# Patient Record
Sex: Female | Born: 1950 | Race: Black or African American | Hispanic: No | Marital: Single | Smoking: Former smoker
Health system: Southern US, Community
[De-identification: ages and names within clinical notes are randomized; demographics above are authoritative.]

## PROBLEM LIST (undated history)

## (undated) DIAGNOSIS — I4892 Unspecified atrial flutter: Secondary | ICD-10-CM

## (undated) DIAGNOSIS — K922 Gastrointestinal hemorrhage, unspecified: Secondary | ICD-10-CM

## (undated) DIAGNOSIS — I509 Heart failure, unspecified: Secondary | ICD-10-CM

## (undated) DIAGNOSIS — E669 Obesity, unspecified: Secondary | ICD-10-CM

## (undated) DIAGNOSIS — G4730 Sleep apnea, unspecified: Secondary | ICD-10-CM

## (undated) DIAGNOSIS — R001 Bradycardia, unspecified: Secondary | ICD-10-CM

## (undated) DIAGNOSIS — I10 Essential (primary) hypertension: Secondary | ICD-10-CM

## (undated) DIAGNOSIS — K219 Gastro-esophageal reflux disease without esophagitis: Secondary | ICD-10-CM

## (undated) DIAGNOSIS — I639 Cerebral infarction, unspecified: Secondary | ICD-10-CM

## (undated) HISTORY — PX: TONSILLECTOMY: SUR1361

## (undated) HISTORY — PX: APPENDECTOMY: SHX54

## (undated) HISTORY — PX: BLADDER REPAIR: SHX76

## (undated) HISTORY — PX: OVARY SURGERY: SHX727

---

## 2008-11-30 ENCOUNTER — Ambulatory Visit: Payer: Self-pay | Admitting: Interventional Radiology

## 2008-11-30 ENCOUNTER — Emergency Department (HOSPITAL_BASED_OUTPATIENT_CLINIC_OR_DEPARTMENT_OTHER): Admission: EM | Admit: 2008-11-30 | Discharge: 2008-11-30 | Payer: Self-pay | Admitting: Emergency Medicine

## 2008-12-05 DIAGNOSIS — I639 Cerebral infarction, unspecified: Secondary | ICD-10-CM

## 2008-12-05 HISTORY — DX: Cerebral infarction, unspecified: I63.9

## 2010-08-13 ENCOUNTER — Emergency Department (HOSPITAL_BASED_OUTPATIENT_CLINIC_OR_DEPARTMENT_OTHER): Admission: EM | Admit: 2010-08-13 | Discharge: 2010-08-13 | Payer: Self-pay | Admitting: Emergency Medicine

## 2011-09-09 LAB — CBC
MCV: 92.7 fL (ref 78.0–100.0)
RBC: 4.22 MIL/uL (ref 3.87–5.11)
WBC: 5.6 10*3/uL (ref 4.0–10.5)

## 2011-09-09 LAB — POCT CARDIAC MARKERS
Myoglobin, poc: 104 ng/mL (ref 12–200)
Troponin i, poc: <0.05 ng/mL (ref 0.00–0.09)

## 2011-09-09 LAB — BASIC METABOLIC PANEL
Chloride: 104 mEq/L (ref 96–112)
Creatinine, Ser: 0.9 mg/dL (ref 0.4–1.2)
GFR calc Af Amer: >60 mL/min (ref >60)
Potassium: 3.3 mEq/L — ABNORMAL LOW (ref 3.5–5.1)

## 2011-09-09 LAB — DIFFERENTIAL
Eosinophils Absolute: 0.1 10*3/uL (ref 0.0–0.7)
Lymphs Abs: 1.4 10*3/uL (ref 0.7–4.0)
Monocytes Relative: 8 % (ref 3–12)
Neutrophils Relative %: 63 % (ref 43–77)

## 2012-01-18 ENCOUNTER — Encounter (HOSPITAL_BASED_OUTPATIENT_CLINIC_OR_DEPARTMENT_OTHER): Payer: Self-pay | Admitting: *Deleted

## 2012-01-18 DIAGNOSIS — R0602 Shortness of breath: Secondary | ICD-10-CM | POA: Insufficient documentation

## 2012-01-18 DIAGNOSIS — J45909 Unspecified asthma, uncomplicated: Secondary | ICD-10-CM | POA: Insufficient documentation

## 2012-01-18 DIAGNOSIS — I509 Heart failure, unspecified: Secondary | ICD-10-CM | POA: Insufficient documentation

## 2012-01-18 DIAGNOSIS — I10 Essential (primary) hypertension: Secondary | ICD-10-CM | POA: Insufficient documentation

## 2012-01-18 NOTE — ED Notes (Signed)
Patient states that she "can't breathe". Has a productive cough, short of breath in triage

## 2012-01-19 ENCOUNTER — Emergency Department (INDEPENDENT_AMBULATORY_CARE_PROVIDER_SITE_OTHER): Payer: PRIVATE HEALTH INSURANCE

## 2012-01-19 ENCOUNTER — Emergency Department (HOSPITAL_BASED_OUTPATIENT_CLINIC_OR_DEPARTMENT_OTHER)
Admission: EM | Admit: 2012-01-19 | Discharge: 2012-01-19 | Disposition: A | Discharge status: Home / Self Care | Payer: PRIVATE HEALTH INSURANCE | Attending: Emergency Medicine | Admitting: Emergency Medicine

## 2012-01-19 ENCOUNTER — Other Ambulatory Visit: Payer: Self-pay

## 2012-01-19 ENCOUNTER — Encounter (HOSPITAL_BASED_OUTPATIENT_CLINIC_OR_DEPARTMENT_OTHER): Payer: Self-pay | Admitting: *Deleted

## 2012-01-19 DIAGNOSIS — R05 Cough: Secondary | ICD-10-CM

## 2012-01-19 DIAGNOSIS — I517 Cardiomegaly: Secondary | ICD-10-CM

## 2012-01-19 DIAGNOSIS — I509 Heart failure, unspecified: Secondary | ICD-10-CM

## 2012-01-19 DIAGNOSIS — I289 Disease of pulmonary vessels, unspecified: Secondary | ICD-10-CM

## 2012-01-19 DIAGNOSIS — R0989 Other specified symptoms and signs involving the circulatory and respiratory systems: Secondary | ICD-10-CM

## 2012-01-19 HISTORY — DX: Essential (primary) hypertension: I10

## 2012-01-19 HISTORY — DX: Heart failure, unspecified: I50.9

## 2012-01-19 HISTORY — DX: Unspecified atrial flutter: I48.92

## 2012-01-19 LAB — CBC
Platelets: 186 10*3/uL (ref 150–400)
RDW: 14.6 % (ref 11.5–15.5)
WBC: 8.8 10*3/uL (ref 4.0–10.5)

## 2012-01-19 LAB — DIFFERENTIAL
Basophils Absolute: 0 10*3/uL (ref 0.0–0.1)
Lymphocytes Relative: 9 % — ABNORMAL LOW (ref 12–46)
Neutro Abs: 7.5 10*3/uL (ref 1.7–7.7)
Neutrophils Relative %: 85 % — ABNORMAL HIGH (ref 43–77)

## 2012-01-19 LAB — COMPREHENSIVE METABOLIC PANEL
ALT: 22 U/L (ref 0–35)
AST: 22 U/L (ref 0–37)
CO2: 32 mEq/L (ref 19–32)
Chloride: 100 mEq/L (ref 96–112)
GFR calc non Af Amer: 68 mL/min — ABNORMAL LOW (ref >90)
Sodium: 142 mEq/L (ref 135–145)
Total Bilirubin: 0.4 mg/dL (ref 0.3–1.2)

## 2012-01-19 LAB — PRO B NATRIURETIC PEPTIDE: Pro B Natriuretic peptide (BNP): 1114 pg/mL — ABNORMAL HIGH (ref 0–125)

## 2012-01-19 MED ORDER — IPRATROPIUM BROMIDE 0.02 % IN SOLN
RESPIRATORY_TRACT | Status: AC
  Filled 2012-01-19: qty 2.5

## 2012-01-19 MED ORDER — ENOXAPARIN SODIUM 60 MG/0.6ML ~~LOC~~ SOLN
60.0000 mg | Freq: Once | SUBCUTANEOUS | Status: AC
  Administered 2012-01-19: 60 mg via SUBCUTANEOUS
  Filled 2012-01-19: qty 0.6

## 2012-01-19 MED ORDER — ALBUTEROL SULFATE (5 MG/ML) 0.5% IN NEBU
INHALATION_SOLUTION | RESPIRATORY_TRACT | Status: AC
  Filled 2012-01-19: qty 1

## 2012-01-19 MED ORDER — ALBUTEROL SULFATE (5 MG/ML) 0.5% IN NEBU
5.0000 mg | INHALATION_SOLUTION | Freq: Once | RESPIRATORY_TRACT | Status: AC
  Administered 2012-01-19: 5 mg via RESPIRATORY_TRACT

## 2012-01-19 MED ORDER — NITROGLYCERIN 2 % TD OINT
0.5000 [in_us] | TOPICAL_OINTMENT | Freq: Once | TRANSDERMAL | Status: AC
  Administered 2012-01-19: 0.5 [in_us] via TOPICAL
  Filled 2012-01-19: qty 30

## 2012-01-19 MED ORDER — ALBUTEROL SULFATE (5 MG/ML) 0.5% IN NEBU
5.0000 mg | INHALATION_SOLUTION | Freq: Once | RESPIRATORY_TRACT | Status: AC
  Administered 2012-01-19: 5 mg via RESPIRATORY_TRACT
  Filled 2012-01-19: qty 1

## 2012-01-19 MED ORDER — IPRATROPIUM BROMIDE 0.02 % IN SOLN
0.5000 mg | Freq: Once | RESPIRATORY_TRACT | Status: AC
  Administered 2012-01-19: 0.5 mg via RESPIRATORY_TRACT

## 2012-01-19 MED ORDER — FUROSEMIDE 10 MG/ML IJ SOLN
80.0000 mg | Freq: Once | INTRAMUSCULAR | Status: AC
  Administered 2012-01-19: 80 mg via INTRAVENOUS
  Filled 2012-01-19: qty 8

## 2012-01-19 NOTE — ED Notes (Signed)
Pt states she was also exposed to heavy perfume last week, and thinks that may have excacerbated her asthma. Breathing has been getting progressively worse over the past several days.

## 2012-01-19 NOTE — Discharge Instructions (Signed)
As discussed, your symptoms are most consistent with an exacerbation of your heart failure.  Please make sure to return to the emergency department for any concerning changes in her condition, such as pain, loss of consciousness, new difficulty breathing.  It is very important that you continue to have your condition evaluated and managed by your physician.  If you are unable to speak with your cardiologist tomorrow, please contact the advanced heart failure clinic to arrange a followup visit.   Heart Failure Heart failure (HF) is a condition in which the heart has trouble pumping blood. This means your heart does not pump blood efficiently for your body to work well. In some cases of HF, fluid may back up into your lungs or you may have swelling (edema) in your lower legs. HF is a long-term (chronic) condition. It is important for you to take good care of yourself and follow your caregiver's treatment plan. CAUSES   Health conditions:   High blood pressure (hypertension) causes the heart muscle to work harder than normal. When pressure in the blood vessels is high, the heart needs to pump (contract) with more force in order to circulate blood throughout the body. High blood pressure eventually causes the heart to become stiff and weak.   Coronary artery disease (CAD) is the buildup of cholesterol and fat (plaques) in the arteries of the heart. The blockage in the arteries deprives the heart muscle of oxygen and blood. This can cause chest pain and may lead to a heart attack. High blood pressure can also contribute to CAD.   Heart attack (myocardial infarction) occurs when 1 or more arteries in the heart become blocked. The loss of oxygen damages the muscle tissue of the heart. When this happens, part of the heart muscle dies. The injured tissue does not contract as well and weakens the heart's ability to pump blood.   Abnormal heart valves can cause HF when the heart valves do not open and close  properly. This makes the heart muscle pump harder to keep the blood flowing.   Heart muscle disease (cardiomyopathy or myocarditis) is damage to the heart muscle from a variety of causes. These can include drug or alcohol abuse, infections, or unknown reasons. These can increase the risk of HF.   Lung disease makes the heart work harder because the lungs do not work properly. This can cause a strain on the heart leading it to fail.   Diabetes increases the risk of HF. High blood sugar contributes to high fat (lipid) levels in the blood. Diabetes can also cause slow damage to tiny blood vessels that carry important nutrients to the heart muscle. When the heart does not get enough oxygen and food, it can cause the heart to become weak and stiff. This leads to a heart that does not contract efficiently.   Other diseases can contribute to HF. These include abnormal heart rhythms, thyroid problems, and low blood counts (anemia).   Unhealthy lifestyle habits:   Obesity.   Smoking.   Eating foods high in fat and cholesterol.   Eating or drinking beverages high in salt.   Drug or alcohol abuse.   Lack of exercise.  SYMPTOMS  HF symptoms may vary and can be hard to detect. Symptoms may include:  Shortness of breath with activity, such as climbing stairs.   Persistent cough.   Swelling of the feet, ankles, legs, or abdomen.   Unexplained weight gain.   Difficulty breathing when lying flat.  Waking from sleep because of the need to sit up and get more air.   Rapid heartbeat.   Fatigue and loss of energy.   Feeling lightheaded or close to fainting.  DIAGNOSIS  A diagnosis of HF is based on your history, symptoms, physical examination, and diagnostic tests. Diagnostic tests for HF may include:  EKG.   Chest X-ray.   Blood tests.   Exercise stress test.   Blood oxygen test (arterial blood gas).   Evaluation by a heart doctor (cardiologist).   Ultrasound evaluation of the  heart (echocardiogram).   Heart artery test to look for blockages (angiogram).   Radioactive imaging to look at the heart (radionuclide test).  TREATMENT  Treatment is aimed at managing the symptoms of HF. Medicines, lifestyle changes, or surgical intervention may be necessary to treat HF.  Medicines to help treat HF may include:   Angiotensin-converting enzyme (ACE) inhibitors. These block the effects of a blood protein called angiotensin-converting enzyme. ACE inhibitors relax (dilate) the blood vessels and help lower blood pressure. This decreases the workload of the heart, slows the progression of HF, and improves symptoms.   Angiotensin receptor blockers (ARBs). These medications work similar to ACE inhibitors. ARBs may be an alternative for people who cannot tolerate an ACE inhibitor.   Aldosterone antagonists. This medication helps get rid of extra fluid from your body. This lowers the volume of blood the heart has to pump.   Water pills (diuretics). Diuretics cause the kidneys to remove salt and water from the blood. The extra fluid is removed by urination. By removing extra fluid from the body, diuretics help lower the workload of the heart and help prevent fluid buildup in the lungs so breathing is easier.   Beta blockers. These prevent the heart from beating too fast and improve heart muscle strength. Beta blockers help maintain a normal heart rate, control blood pressure, and improve HF symptoms.   Digitalis. This increases the force of the heartbeat and may be helpful to people with HF or heart rhythm problems.   Healthy lifestyle changes include:   Stopping smoking.   Eating a healthy diet. Avoid foods high in fat. Avoid foods fried in oil or made with fat. A dietician can help with healthy food choices.   Limiting how much salt you eat.   Limiting alcohol intake to no more than 1 drink per day for women and 2 drinks per day for men. Drinking more than that is harmful to  your heart. If your heart has already been damaged by alcohol or you have severe HF, drinking alcohol should be stopped completely.   Exercising as directed by your caregiver.   Surgical treatment for HF may include:   Procedures to open blocked arteries, repair damaged heart valves, or remove damaged heart muscle tissue.   A pacemaker to help heart muscle function and to control certain abnormal heart rhythms.   A defibrillator to possibly prevent sudden cardiac death.  HOME CARE INSTRUCTIONS   Activity level. Your caregiver can help you determine what type of exercise program may be helpful. It is important to maintain your strength. Pace your physical activity to avoid shortness of breath or chest pain. Rest for 1 hour before and after meals. A cardiac rehabilitation program may be helpful to some people with HF.   Diet. Eat a heart healthy diet. Food choices should be low in saturated fat and cholesterol. Talk to a dietician to learn about heart healthy foods.   Salt  intake. When you have HF, you need to limit the amount of salt you eat. Eat less than 1500 milligrams (mg) of salt per day or as recommended by your caregiver.   Weight monitoring. Weigh yourself every day. You should weigh yourself in the morning after you urinate and before you eat breakfast. Wear the same amount of clothing each time you weigh yourself. Record your weight daily. Bring your recorded weights to your clinic visits. Tell your caregiver right away if you have gained 3 lb/1.4 kg in 1 day, or 5 lb/2.3 kg in a week or whatever amount you were told to report.   Blood pressure monitoring. This should be done as directed by your caregiver. A home blood pressure cuff can be purchased at a drugstore. Record your blood pressure numbers and bring them to your clinic visits. Tell your caregiver if you become dizzy or lightheaded upon standing up.   Smoking. If you are currently a smoker, it is time to quit. Nicotine makes  your heart work harder by causing your blood vessels to constrict. Do not use nicotine gum or patches before talking to your caregiver.   Follow up. Be sure to schedule a follow-up visit with your caregiver. Keep all your appointments.  SEEK MEDICAL CARE IF:   Your weight increases by 3 lb/1.4 kg in 1 day or 5 lb/2.3 kg in a week.   You notice increasing shortness of breath that is unusual for you. This may happen during rest, sleep, or with activity.   You cough more than normal, especially with physical activity.   You notice more swelling in your hands, feet, ankles, or belly (abdomen).   You are unable to sleep because it is hard to breathe.   You cough up bloody mucus (sputum).   You begin to feel "jumping" or "fluttering" sensations (palpitations) in your chest.  SEEK IMMEDIATE MEDICAL CARE IF:   You have severe chest pain or pressure which may include symptoms such as:   Pain or pressure in the arms, neck, jaw, or back.   Feeling sweaty.   Feeling sick to your stomach (nauseous).   Feeling short of breath while at rest.   Having a fast or irregular heartbeat.   You experience stroke symptoms. These symptoms include:   Facial weakness or numbness.   Weakness or numbness in an arm, leg, or on one side of your body.   Blurred vision.   Difficulty talking or thinking.   Dizziness or fainting.   Severe headache.  THESE ARE MEDICAL EMERGENCIES. Do not wait to see if the symptoms go away. Call your local emergency services (911 in [place] DO NOT drive yourself to the hospital. IMPORTANT  Make a list of every medicine, vitamin, or herbal supplement you are taking. Keep the list with you at all times. Show it to your caregiver at every visit. Keep the list up-to-date.   Ask your caregiver or pharmacist to write an explanation of each medicine you are taking. This should include:   Why you are taking it.   The possible side effects.   The best time of day to take  it.   Foods to take with it or what foods to avoid.   When to stop taking it.  MAKE SURE YOU:   Understand these instructions.   Will watch your condition.   Will get help right away if you are not doing well or get worse.  Document Released: 11/21/2005 Document Revised: 08/03/2011 Document Reviewed:  03/05/2010 [employer] Patient Information 2012 [place], [place].

## 2012-01-19 NOTE — ED Provider Notes (Signed)
History     CSN: [id number]  Arrival date & time 01/18/12  2348   First MD Initiated Contact with Patient 01/19/12 0012      Chief Complaint  Patient presents with  . Shortness of Breath    HPI This patient with multiple medical problems now presents with 2 days of dyspnea, cough, congestion.  She notes that one week ago she had several days of nausea, vomiting, diarrhea, but has generally been in her usual state of health.  She notes that these symptoms began gradually, since onset she has had persistent mild dyspnea, progressive cough, congestion (seemingly focally in her superior sternum).  She notes mild improvement with albuterol.  No clear exacerbating factors.  No fever, no chest pain, no abdominal pain no vomiting, no diarrhea. No new lower extremity edema. The patient notes that she has been compliant with her medications, including Lasix and Coumadin.  Her medical history includes dysrhythmia although the patient does not recall which type, and congestive heart failure. Past Medical History  Diagnosis Date  . Hypertension   . Asthma     Past Surgical History  Procedure Date  . Appendectomy   . Bladder repair   . Ovary surgery     No family history on file.  History  Substance Use Topics  . Smoking status: Former Games developer  . Smokeless tobacco: Not on file  . Alcohol Use: No    OB History    Grav Para Term Preterm Abortions TAB SAB Ect Mult Living                  Review of Systems  Constitutional:       HPI  HENT:       HPI otherwise negative  Eyes: Negative.   Respiratory:       HPI, otherwise negative  Cardiovascular:       HPI, otherwise nmegative  Gastrointestinal: Positive for vomiting.  Genitourinary:       HPI, otherwise negative  Musculoskeletal:       HPI, otherwise negative  Skin: Negative.   Neurological: Negative for syncope.    Allergies  Review of patient's allergies indicates not on file.  Home Medications   Current Outpatient  Rx  Name Route Sig Dispense Refill  . ASPIRIN 81 MG PO TABS Oral Take 81 mg by mouth daily.    . BUDESONIDE-FORMOTEROL FUMARATE 160-4.5 MCG/ACT IN AERO Inhalation Inhale 2 puffs into the lungs 2 (two) times daily.    . FUROSEMIDE 20 MG PO TABS Oral Take 20 mg by mouth 2 (two) times daily.    . WARFARIN SODIUM 1 MG PO TABS Oral Take 1 mg by mouth as directed.      BP 154/73  Pulse 74  Temp(Src) 97.1 F (36.2 C) (Oral)  Resp 20  SpO2 96%  Physical Exam  Nursing note and vitals reviewed. Constitutional: She is oriented to person, place, and time. She appears well-developed and well-nourished. No distress.  HENT:  Head: Normocephalic and atraumatic.  Eyes: Conjunctivae and EOM are normal.  Cardiovascular: Normal rate and regular rhythm.   Pulmonary/Chest: No stridor. Tachypnea noted. No respiratory distress. She has wheezes.  Abdominal: She exhibits no distension.  Musculoskeletal: She exhibits no edema.  Neurological: She is alert and oriented to person, place, and time. No cranial nerve deficit.  Skin: Skin is warm and dry.  Psychiatric: She has a normal mood and affect.    ED Course  Procedures (including critical care time)  Labs Reviewed  DIFFERENTIAL - Abnormal; Notable for the following:    Neutrophils Relative 85 (*)    Lymphocytes Relative 9 (*)    All other components within normal limits  PROTIME-INR - Abnormal; Notable for the following:    Prothrombin Time 16.4 (*)    All other components within normal limits  CBC  COMPREHENSIVE METABOLIC PANEL  PRO B NATRIURETIC PEPTIDE   Dg Chest 2 View  01/19/2012  *RADIOLOGY REPORT*  Clinical Data: Cough, congestion, wheezing.  CHEST - 2 VIEW  Comparison: 11/30/2008  Findings: Cardiac enlargement with increased pulmonary vascularity. No definite edema.  No blunting of costophrenic angles.  No focal consolidation.  No pneumothorax.  Calcified and tortuous aorta. Degenerative changes in the thoracic spine.  IMPRESSION: Cardiac  enlargement with pulmonary vascular congestion.  No edema or focal consolidation.  Original Report Authenticated By: Marlon Pel, M.D.   xr reviewed by me   Date: 01/19/2012  Rate: 66  Rhythm: atrial fibrillation  QRS Axis: normal  Intervals: PR shortened  ST/T Wave abnormalities: nonspecific T wave changes  Conduction Disutrbances:right bundle branch block and left anterior fascicular block  Narrative Interpretation:   Old EKG Reviewed: changes noted ABNORMAL ECG  Pulse ox 97% ra- normal   Cardiac monitor 65 afib, abnormal   No diagnosis found.    MDM  This patient with multiple medical problems, including dysrhythmia, heart failure now presents with 2 days of cough and congestion with dyspnea.  On exam the patient is in no distress with bilateral wheezing.  The patient's labs and x-ray are suggestive of heart failure exacerbation.  The absence of distress his reassuring.  The patient had symptomatic improvement with emergency department interventions, including albuterol nebulizer, Lasix, nitroglycerin glycerin, supplemental oxygen.  The absence of significant pain, distress, remarkably abnormal vital signs his reassuring the patient has a cardiologist available for followup, and was provided referral to be advanced heart failure clinic as well.  She was discharged in stable condition with the expectation to followup promptly for continued evaluation, and consideration of medication changes.        Gerhard Munch, MD 01/19/12 [phone]

## 2012-01-19 NOTE — ED Notes (Signed)
Pt transported to XR at this time.

## 2012-01-19 NOTE — ED Notes (Signed)
Pt aware to f/u with PMD or cardiology asap. Pt reports she will call her doctors this morning. States her breathing has improved greatly since arrival, and feels safe going home.

## 2012-01-19 NOTE — ED Notes (Signed)
Pt has an hx of asthma and takes HHN Albuterol, MDI Albuterol, and Symbicort at home. Pt presented to teh ED with some SHOB from a cold that has been going on for a few weeks and has gotten progressively worse.

## 2012-01-19 NOTE — ED Notes (Signed)
Pt states she took 20mg  prednisone PTA at home. States it was left over from a previous prescription, but that those were the last pills left.

## 2012-01-19 NOTE — ED Notes (Signed)
Dr Jeraldine Loots at bedside discussing test results and further care.

## 2012-12-05 HISTORY — PX: PACEMAKER INSERTION: SHX728

## 2014-07-27 ENCOUNTER — Emergency Department (HOSPITAL_BASED_OUTPATIENT_CLINIC_OR_DEPARTMENT_OTHER): Payer: Medicaid Other

## 2014-07-27 ENCOUNTER — Emergency Department (HOSPITAL_BASED_OUTPATIENT_CLINIC_OR_DEPARTMENT_OTHER)
Admission: EM | Admit: 2014-07-27 | Discharge: 2014-07-27 | Disposition: A | Discharge status: Home / Self Care | Payer: Medicaid Other | Attending: Emergency Medicine | Admitting: Emergency Medicine

## 2014-07-27 ENCOUNTER — Encounter (HOSPITAL_BASED_OUTPATIENT_CLINIC_OR_DEPARTMENT_OTHER): Payer: Self-pay | Admitting: Emergency Medicine

## 2014-07-27 DIAGNOSIS — I509 Heart failure, unspecified: Secondary | ICD-10-CM | POA: Diagnosis not present

## 2014-07-27 DIAGNOSIS — M79604 Pain in right leg: Secondary | ICD-10-CM

## 2014-07-27 DIAGNOSIS — I4892 Unspecified atrial flutter: Secondary | ICD-10-CM | POA: Insufficient documentation

## 2014-07-27 DIAGNOSIS — Z79899 Other long term (current) drug therapy: Secondary | ICD-10-CM | POA: Diagnosis not present

## 2014-07-27 DIAGNOSIS — Z87891 Personal history of nicotine dependence: Secondary | ICD-10-CM | POA: Insufficient documentation

## 2014-07-27 DIAGNOSIS — J159 Unspecified bacterial pneumonia: Secondary | ICD-10-CM | POA: Diagnosis not present

## 2014-07-27 DIAGNOSIS — Z7901 Long term (current) use of anticoagulants: Secondary | ICD-10-CM | POA: Diagnosis not present

## 2014-07-27 DIAGNOSIS — I10 Essential (primary) hypertension: Secondary | ICD-10-CM | POA: Insufficient documentation

## 2014-07-27 DIAGNOSIS — E669 Obesity, unspecified: Secondary | ICD-10-CM | POA: Diagnosis not present

## 2014-07-27 DIAGNOSIS — J45901 Unspecified asthma with (acute) exacerbation: Secondary | ICD-10-CM | POA: Diagnosis not present

## 2014-07-27 DIAGNOSIS — Z7982 Long term (current) use of aspirin: Secondary | ICD-10-CM | POA: Insufficient documentation

## 2014-07-27 DIAGNOSIS — M79609 Pain in unspecified limb: Secondary | ICD-10-CM | POA: Insufficient documentation

## 2014-07-27 DIAGNOSIS — J189 Pneumonia, unspecified organism: Secondary | ICD-10-CM

## 2014-07-27 DIAGNOSIS — Z8673 Personal history of transient ischemic attack (TIA), and cerebral infarction without residual deficits: Secondary | ICD-10-CM | POA: Diagnosis not present

## 2014-07-27 HISTORY — DX: Obesity, unspecified: E66.9

## 2014-07-27 HISTORY — DX: Cerebral infarction, unspecified: I63.9

## 2014-07-27 LAB — CBC WITH DIFFERENTIAL/PLATELET
BASOS ABS: 0 10*3/uL (ref 0.0–0.1)
Basophils Relative: 0 % (ref 0–1)
Eosinophils Absolute: 0.1 10*3/uL (ref 0.0–0.7)
Eosinophils Relative: 3 % (ref 0–5)
HCT: 38.9 % (ref 36.0–46.0)
Hemoglobin: 11.9 g/dL — ABNORMAL LOW (ref 12.0–15.0)
LYMPHS ABS: 0.7 10*3/uL (ref 0.7–4.0)
LYMPHS PCT: 17 % (ref 12–46)
MCH: 28.7 pg (ref 26.0–34.0)
MCHC: 30.6 g/dL (ref 30.0–36.0)
MCV: 94 fL (ref 78.0–100.0)
Monocytes Absolute: 0.4 10*3/uL (ref 0.1–1.0)
Monocytes Relative: 10 % (ref 3–12)
NEUTROS PCT: 70 % (ref 43–77)
Neutro Abs: 3 10*3/uL (ref 1.7–7.7)
PLATELETS: 170 10*3/uL (ref 150–400)
RBC: 4.14 MIL/uL (ref 3.87–5.11)
RDW: 13.3 % (ref 11.5–15.5)
WBC: 4.3 10*3/uL (ref 4.0–10.5)

## 2014-07-27 LAB — D-DIMER, QUANTITATIVE (NOT AT ARMC): D DIMER QUANT: 1.39 ug{FEU}/mL — AB (ref 0.00–0.48)

## 2014-07-27 LAB — BASIC METABOLIC PANEL
ANION GAP: 12 (ref 5–15)
BUN: 17 mg/dL (ref 6–23)
CO2: 32 meq/L (ref 19–32)
Calcium: 9.9 mg/dL (ref 8.4–10.5)
Chloride: 102 mEq/L (ref 96–112)
Creatinine, Ser: 1.2 mg/dL — ABNORMAL HIGH (ref 0.50–1.10)
GFR calc Af Amer: 55 mL/min — ABNORMAL LOW (ref >90)
GFR calc non Af Amer: 47 mL/min — ABNORMAL LOW (ref >90)
GLUCOSE: 126 mg/dL — AB (ref 70–99)
POTASSIUM: 4.1 meq/L (ref 3.7–5.3)
SODIUM: 146 meq/L (ref 137–147)

## 2014-07-27 MED ORDER — LEVOFLOXACIN 500 MG PO TABS
500.0000 mg | ORAL_TABLET | Freq: Once | ORAL | Status: AC
  Administered 2014-07-27: 500 mg via ORAL
  Filled 2014-07-27: qty 1

## 2014-07-27 MED ORDER — HYDROCODONE-ACETAMINOPHEN 5-325 MG PO TABS: 1.0000 | ORAL_TABLET | ORAL | Status: DC | PRN

## 2014-07-27 MED ORDER — LEVOFLOXACIN 500 MG PO TABS: 500.0000 mg | ORAL_TABLET | Freq: Every day | ORAL | Status: DC

## 2014-07-27 MED ORDER — HYDROCODONE-ACETAMINOPHEN 5-325 MG PO TABS
2.0000 | ORAL_TABLET | Freq: Once | ORAL | Status: AC
  Administered 2014-07-27: 2 via ORAL
  Filled 2014-07-27: qty 2

## 2014-07-27 MED ORDER — METHOCARBAMOL 500 MG PO TABS: 500.0000 mg | ORAL_TABLET | Freq: Three times a day (TID) | ORAL | Status: DC | PRN

## 2014-07-27 MED ORDER — IOHEXOL 350 MG/ML SOLN
100.0000 mL | Freq: Once | INTRAVENOUS | Status: AC | PRN
Start: 2014-07-27 — End: 2014-07-27
  Administered 2014-07-27: 100 mL via INTRAVENOUS

## 2014-07-27 NOTE — Discharge Instructions (Signed)
Musculoskeletal Pain Musculoskeletal pain is muscle and boney aches and pains. These pains can occur in any part of the body. Your caregiver may treat you without knowing the cause of the pain. They may treat you if blood or urine tests, X-rays, and other tests were normal.  CAUSES There is often not a definite cause or reason for these pains. These pains may be caused by a type of germ (virus). The discomfort may also come from overuse. Overuse includes working out too hard when your body is not fit. Boney aches also come from weather changes. Bone is sensitive to atmospheric pressure changes. HOME CARE INSTRUCTIONS   Ask when your test results will be ready. Make sure you get your test results.  Only take over-the-counter or prescription medicines for pain, discomfort, or fever as directed by your caregiver. If you were given medications for your condition, do not drive, operate machinery or power tools, or sign legal documents for 24 hours. Do not drink alcohol. Do not take sleeping pills or other medications that may interfere with treatment.  Continue all activities unless the activities cause more pain. When the pain lessens, slowly resume normal activities. Gradually increase the intensity and duration of the activities or exercise.  During periods of severe pain, bed rest may be helpful. Lay or sit in any position that is comfortable.  Putting ice on the injured area.  Put ice in a bag.  Place a towel between your skin and the bag.  Leave the ice on for 15 to 20 minutes, 3 to 4 times a day.  Follow up with your caregiver for continued problems and no reason can be found for the pain. If the pain becomes worse or does not go away, it may be necessary to repeat tests or do additional testing. Your caregiver may need to look further for a possible cause. SEEK IMMEDIATE MEDICAL CARE IF:  You have pain that is getting worse and is not relieved by medications.  You develop chest pain  that is associated with shortness or breath, sweating, feeling sick to your stomach (nauseous), or throw up (vomit).  Your pain becomes localized to the abdomen.  You develop any new symptoms that seem different or that concern you. MAKE SURE YOU:   Understand these instructions.  Will watch your condition.  Will get help right away if you are not doing well or get worse. Document Released: 11/21/2005 Document Revised: 02/13/2012 Document Reviewed: 07/26/2013 [facility] Patient Information 2015 [place], [place]. This information is not intended to replace advice given to you by your health care provider. Make sure you discuss any questions you have with your health care provider.  Pneumonia, Adult Pneumonia is an infection of the lungs. It may be caused by a germ (virus or bacteria). Some types of pneumonia can spread easily from person to person. This can happen when you cough or sneeze. HOME CARE  Only take medicine as told by your doctor.  Take your medicine (antibiotics) as told. Finish it even if you start to feel better.  Do not smoke.  You may use a vaporizer or humidifier in your room. This can help loosen thick spit (mucus).  Sleep so you are almost sitting up (semi-upright). This helps reduce coughing.  Rest. A shot (vaccine) can help prevent pneumonia. Shots are often advised for:  People over 64 years old.  Patients on chemotherapy.  People with long-term (chronic) lung problems.  People with immune system problems. GET HELP RIGHT AWAY IF:  You are getting worse.  You cannot control your cough, and you are losing sleep.  You cough up blood.  Your pain gets worse, even with medicine.  You have a fever.  Any of your problems are getting worse, not better.  You have shortness of breath or chest pain. MAKE SURE YOU:   Understand these instructions.  Will watch your condition.  Will get help right away if you are not doing well or get worse. Document  Released: 05/09/2008 Document Revised: 02/13/2012 Document Reviewed: 02/11/2011 [facility] Patient Information 2015 [place], [place]. This information is not intended to replace advice given to you by your health care provider. Make sure you discuss any questions you have with your health care provider.

## 2014-07-27 NOTE — ED Provider Notes (Signed)
CSN: [id number]     Arrival date & time 07/27/14  1657 History  This chart was scribed for Rolland Porter, MD by Evon Slack, ED Scribe. This patient was seen in room MH12/MH12 and the patient's care was started at 5:13 PM.      Chief Complaint  Patient presents with  . Leg Pain   Patient is a 63 y.o. female presenting with leg pain. The history is provided by the patient. No language interpreter was used.  Leg Pain Associated symptoms: no fatigue and no fever    HPI Comments: Rebecca Welch is a 63 y.o. female who presents to the Emergency Department complaining of right leg swelling onset 1 day prior. She state she is also having more SOB than normal. She states the pain started out as a knot in her thigh. She states that the knot moved down towards he calf when she applied compression. She states that she was cleaning yesterday when she first noticed the pain. She denies Hx of DVT. She states she no longer is on blood thinners due to being admitted 1 year ago for intestinal bleeding. She denies cough or fever  Past Medical History  Diagnosis Date  . Hypertension   . Asthma   . CHF (congestive heart failure)   . Atrial flutter   . Obesity   . Stroke    Past Surgical History  Procedure Laterality Date  . Appendectomy    . Bladder repair    . Ovary surgery    . Pacemaker insertion     History reviewed. No pertinent family history. History  Substance Use Topics  . Smoking status: Former Games developer  . Smokeless tobacco: Not on file  . Alcohol Use: No   OB History   Grav Para Term Preterm Abortions TAB SAB Ect Mult Living                 Review of Systems  Constitutional: Negative for fever, chills, diaphoresis, appetite change and fatigue.  HENT: Negative for mouth sores, sore throat and trouble swallowing.   Eyes: Negative for visual disturbance.  Respiratory: Positive for shortness of breath. Negative for cough, chest tightness and wheezing.   Cardiovascular: Positive  for leg swelling. Negative for chest pain.  Gastrointestinal: Negative for nausea, vomiting, abdominal pain, diarrhea and abdominal distention.  Endocrine: Negative for polydipsia, polyphagia and polyuria.  Genitourinary: Negative for dysuria, frequency and hematuria.  Musculoskeletal: Positive for gait problem.  Skin: Negative for color change, pallor and rash.  Neurological: Negative for dizziness, syncope, light-headedness and headaches.  Hematological: Does not bruise/bleed easily.  Psychiatric/Behavioral: Negative for behavioral problems and confusion.    Allergies  Review of patient's allergies indicates no known allergies.  Home Medications   Prior to Admission medications   Medication Sig Start Date End Date Taking? Authorizing Provider  flecainide (TAMBOCOR) 100 MG tablet Take 100 mg by mouth 2 (two) times daily.   Yes Historical Provider, MD  losartan (COZAAR) 50 MG tablet Take 50 mg by mouth daily.   Yes Historical Provider, MD  pantoprazole (PROTONIX) 40 MG tablet Take 40 mg by mouth daily.   Yes Historical Provider, MD  aspirin 81 MG tablet Take 81 mg by mouth daily.    Historical Provider, MD  budesonide-formoterol (SYMBICORT) 160-4.5 MCG/ACT inhaler Inhale 2 puffs into the lungs 2 (two) times daily.    Historical Provider, MD  diltiazem (CARDIZEM) 120 MG tablet Take 120 mg by mouth 1 day or 1 dose.  Historical Provider, MD  furosemide (LASIX) 20 MG tablet Take 80 mg by mouth daily.     Historical Provider, MD  HYDROcodone-acetaminophen (NORCO/VICODIN) 5-325 MG per tablet Take 1 tablet by mouth every 4 (four) hours as needed. 07/27/14   Rolland Porter, MD  levofloxacin (LEVAQUIN) 500 MG tablet Take 1 tablet (500 mg total) by mouth daily. 07/27/14   Rolland Porter, MD  methocarbamol (ROBAXIN) 500 MG tablet Take 1 tablet (500 mg total) by mouth 3 (three) times daily between meals as needed. 07/27/14   Rolland Porter, MD  olmesartan-hydrochlorothiazide (BENICAR HCT) 40-12.5 MG per tablet  Take 1 tablet by mouth daily.    Historical Provider, MD  warfarin (COUMADIN) 1 MG tablet Take 1 mg by mouth as directed.    Historical Provider, MD   Triage Vitals: BP 130/78  Pulse 71  Temp(Src) 98.1 F (36.7 C) (Oral)  Resp 22  Ht  (1.854 m)  Wt 346 lb (156.945 kg)  BMI 45.66 kg/m2  SpO2 95%  Physical Exam  Nursing note and vitals reviewed. Constitutional: She is oriented to person, place, and time. She appears well-developed and well-nourished. No distress.  Morbidly obese   HENT:  Head: Normocephalic.  Eyes: Conjunctivae are normal. Pupils are equal, round, and reactive to light. No scleral icterus.  Neck: Normal range of motion. Neck supple. No thyromegaly present.  Cardiovascular: Normal rate and regular rhythm.  Exam reveals no gallop and no friction rub.   No murmur heard. Sinus rhythm on monitor not tachycardic   Pulmonary/Chest: Effort normal and breath sounds normal. No respiratory distress. She has no wheezes. She has no rales.  Abdominal: Soft. Bowel sounds are normal. She exhibits no distension. There is no tenderness. There is no rebound.  Musculoskeletal: Normal range of motion.  Tender to palpation right medial lower thigh and mid line lower leg, no palpable chord.  Neurological: She is alert and oriented to person, place, and time.  Skin: Skin is warm and dry. No rash noted.  Psychiatric: She has a normal mood and affect. Her behavior is normal.    ED Course  Procedures (including critical care time) DIAGNOSTIC STUDIES: Oxygen Saturation is 95% on RA, normal by my interpretation.    COORDINATION OF CARE: 5:35 PM-Discussed treatment plan which includes CXR, CBC panel, BMP, and D-dimer with pt at bedside and pt agreed to plan.     Labs Review Labs Reviewed  CBC WITH DIFFERENTIAL - Abnormal; Notable for the following:    Hemoglobin 11.9 (*)    All other components within normal limits  BASIC METABOLIC PANEL - Abnormal; Notable for the following:     Glucose, Bld 126 (*)    Creatinine, Ser 1.20 (*)    GFR calc non Af Amer 47 (*)    GFR calc Af Amer 55 (*)    All other components within normal limits  D-DIMER, QUANTITATIVE - Abnormal; Notable for the following:    D-Dimer, Quant 1.39 (*)    All other components within normal limits    Imaging Review Dg Chest 2 View  07/27/2014   CLINICAL DATA:  Hypertension and lower extremity edema  EXAM: CHEST  2 VIEW  COMPARISON:  January 19, 2012  FINDINGS: There is mild right base atelectatic change. There is no edema or consolidation. Heart is enlarged with pulmonary vascularity within normal limits. Pacemaker leads are attached to the right atrium and right ventricle. No adenopathy. There is degenerative change in the thoracic spine.  IMPRESSION: Right base  atelectatic change. No edema or consolidation. Cardiomegaly with pacemaker leads attached to the right atrium and right ventricle.   Electronically Signed   By: Bretta Bang M.D.   On: 07/27/2014 18:24   Ct Angio Chest Pe W/cm &/or Wo Cm  07/27/2014   CLINICAL DATA:  Difficulty breathing  EXAM: CT ANGIOGRAPHY CHEST WITH CONTRAST  TECHNIQUE: Multidetector CT imaging of the chest was performed using the standard protocol during bolus administration of intravenous contrast. Multiplanar CT image reconstructions and MIPs were obtained to evaluate the vascular anatomy.  CONTRAST:  OMNIPAQUE IOHEXOL 350 MG/ML SOLN  COMPARISON:  Chest radiograph July 27, 2014  FINDINGS: There is no demonstrable pulmonary embolus. There is no appreciable thoracic aortic aneurysm or dissection.  There is patchy atelectatic change in the right lower lobe. There is a small area of consolidation in the inferior lingula. Elsewhere the lungs are clear.  Heart is mildly enlarged. The pericardium is not thickened. There are scattered foci of coronary artery calcification. Pacemaker leads are attached to the right atrium and right ventricle.  There is no appreciable  thoracic adenopathy.  In the visualized upper abdomen, there is an upper pole right renal cyst measuring 3.5 x 2.4 cm.  There are no blastic or lytic bone lesions. There is degenerative change in the thoracic spine. Thyroid appears unremarkable.  Review of the MIP images confirms the above findings.  IMPRESSION: No demonstrable pulmonary embolus. Small area of infiltrate inferior lingula. Patchy atelectasis right lower lobe. Mild cardiomegaly.   Electronically Signed   By: Bretta Bang M.D.   On: 07/27/2014 19:23   US Venous Img Lower Unilateral Right  07/27/2014   CLINICAL DATA:  Focal swelling and pain in the proximal right calf.  EXAM: RIGHT LOWER EXTREMITY VENOUS DOPPLER ULTRASOUND  TECHNIQUE: Gray-scale sonography with graded compression, as well as color Doppler and duplex ultrasound were performed to evaluate the lower extremity deep venous systems from the level of the common femoral vein and including the common femoral, femoral, profunda femoral, popliteal and calf veins including the posterior tibial, peroneal and gastrocnemius veins when visible. The superficial great saphenous vein was also interrogated. Spectral Doppler was utilized to evaluate flow at rest and with distal augmentation maneuvers in the common femoral, femoral and popliteal veins.  COMPARISON:  None.  FINDINGS: Common Femoral Vein: No evidence of thrombus. Normal compressibility, respiratory phasicity and response to augmentation.  Saphenofemoral Junction: No evidence of thrombus. Normal compressibility and flow on color Doppler imaging.  Profunda Femoral Vein: No evidence of thrombus. Normal compressibility and flow on color Doppler imaging.  Femoral Vein: No evidence of thrombus. Normal compressibility, respiratory phasicity and response to augmentation.  Popliteal Vein: No evidence of thrombus. Normal compressibility, respiratory phasicity and response to augmentation.  Calf Veins: No evidence of thrombus. Normal  compressibility and flow on color Doppler imaging.  Superficial Great Saphenous Vein: No evidence of thrombus. Normal compressibility and flow on color Doppler imaging.  Venous Reflux:  None.  Other Findings:  None.  IMPRESSION: No evidence of deep venous thrombosis.   Electronically Signed   By: Gordan Payment M.D.   On: 07/27/2014 19:39     EKG Interpretation   Date/Time:  Sunday July 27 2014 18:08:24 EDT Ventricular Rate:  69 PR Interval:    QRS Duration: 188 QT Interval:  484 QTC Calculation: 518 R Axis:   -58 Text Interpretation:  paced Abnormal ECG Reconfirmed by Fayrene Fearing  MD, Markeia Harkless  [phone]) on 07/27/2014 7:43:46 PM  MDM   Final diagnoses:  Community acquired pneumonia  Pain of right lower extremity        I personally performed the services described in this documentation, which was scribed in my presence. The recorded information has been reviewed and is accurate.      Rolland Porter, MD 07/27/14 2005

## 2014-07-27 NOTE — ED Notes (Signed)
Pt here with pain in right leg, swelling in calf.  Pt states that this began yesterday and was not associated with any trauma, pt on O2 with increasing sob since this leg swelling began

## 2014-07-29 ENCOUNTER — Telehealth (HOSPITAL_BASED_OUTPATIENT_CLINIC_OR_DEPARTMENT_OTHER): Payer: Self-pay | Admitting: Emergency Medicine

## 2015-02-23 ENCOUNTER — Emergency Department (HOSPITAL_BASED_OUTPATIENT_CLINIC_OR_DEPARTMENT_OTHER): Payer: Medicare Other

## 2015-02-23 ENCOUNTER — Encounter (HOSPITAL_BASED_OUTPATIENT_CLINIC_OR_DEPARTMENT_OTHER): Payer: Self-pay | Admitting: *Deleted

## 2015-02-23 ENCOUNTER — Inpatient Hospital Stay (HOSPITAL_BASED_OUTPATIENT_CLINIC_OR_DEPARTMENT_OTHER)
Admission: EM | Admit: 2015-02-23 | Discharge: 2015-03-04 | DRG: 291 | Disposition: A | Discharge status: Home Health | Payer: Medicare Other | Attending: Internal Medicine | Admitting: Internal Medicine

## 2015-02-23 DIAGNOSIS — J9621 Acute and chronic respiratory failure with hypoxia: Secondary | ICD-10-CM | POA: Diagnosis present

## 2015-02-23 DIAGNOSIS — Z79899 Other long term (current) drug therapy: Secondary | ICD-10-CM

## 2015-02-23 DIAGNOSIS — Z833 Family history of diabetes mellitus: Secondary | ICD-10-CM

## 2015-02-23 DIAGNOSIS — Z87891 Personal history of nicotine dependence: Secondary | ICD-10-CM

## 2015-02-23 DIAGNOSIS — J209 Acute bronchitis, unspecified: Secondary | ICD-10-CM | POA: Diagnosis present

## 2015-02-23 DIAGNOSIS — R109 Unspecified abdominal pain: Secondary | ICD-10-CM | POA: Diagnosis present

## 2015-02-23 DIAGNOSIS — I10 Essential (primary) hypertension: Secondary | ICD-10-CM | POA: Diagnosis present

## 2015-02-23 DIAGNOSIS — Z8673 Personal history of transient ischemic attack (TIA), and cerebral infarction without residual deficits: Secondary | ICD-10-CM

## 2015-02-23 DIAGNOSIS — R0600 Dyspnea, unspecified: Secondary | ICD-10-CM | POA: Diagnosis not present

## 2015-02-23 DIAGNOSIS — T501X5A Adverse effect of loop [high-ceiling] diuretics, initial encounter: Secondary | ICD-10-CM | POA: Diagnosis present

## 2015-02-23 DIAGNOSIS — I639 Cerebral infarction, unspecified: Secondary | ICD-10-CM | POA: Diagnosis present

## 2015-02-23 DIAGNOSIS — R0602 Shortness of breath: Secondary | ICD-10-CM | POA: Diagnosis present

## 2015-02-23 DIAGNOSIS — Z950 Presence of cardiac pacemaker: Secondary | ICD-10-CM

## 2015-02-23 DIAGNOSIS — G4733 Obstructive sleep apnea (adult) (pediatric): Secondary | ICD-10-CM | POA: Diagnosis present

## 2015-02-23 DIAGNOSIS — Z7982 Long term (current) use of aspirin: Secondary | ICD-10-CM

## 2015-02-23 DIAGNOSIS — I482 Chronic atrial fibrillation: Secondary | ICD-10-CM | POA: Diagnosis present

## 2015-02-23 DIAGNOSIS — D509 Iron deficiency anemia, unspecified: Secondary | ICD-10-CM | POA: Diagnosis present

## 2015-02-23 DIAGNOSIS — R1310 Dysphagia, unspecified: Secondary | ICD-10-CM | POA: Diagnosis not present

## 2015-02-23 DIAGNOSIS — I5033 Acute on chronic diastolic (congestive) heart failure: Secondary | ICD-10-CM | POA: Diagnosis not present

## 2015-02-23 DIAGNOSIS — J45901 Unspecified asthma with (acute) exacerbation: Secondary | ICD-10-CM | POA: Diagnosis present

## 2015-02-23 DIAGNOSIS — Z7901 Long term (current) use of anticoagulants: Secondary | ICD-10-CM

## 2015-02-23 DIAGNOSIS — I4892 Unspecified atrial flutter: Secondary | ICD-10-CM | POA: Diagnosis present

## 2015-02-23 DIAGNOSIS — N179 Acute kidney failure, unspecified: Secondary | ICD-10-CM | POA: Diagnosis not present

## 2015-02-23 DIAGNOSIS — R6511 Systemic inflammatory response syndrome (SIRS) of non-infectious origin with acute organ dysfunction: Secondary | ICD-10-CM | POA: Diagnosis present

## 2015-02-23 DIAGNOSIS — I509 Heart failure, unspecified: Secondary | ICD-10-CM

## 2015-02-23 DIAGNOSIS — E669 Obesity, unspecified: Secondary | ICD-10-CM | POA: Diagnosis present

## 2015-02-23 DIAGNOSIS — Z9981 Dependence on supplemental oxygen: Secondary | ICD-10-CM

## 2015-02-23 DIAGNOSIS — T380X5A Adverse effect of glucocorticoids and synthetic analogues, initial encounter: Secondary | ICD-10-CM | POA: Diagnosis present

## 2015-02-23 DIAGNOSIS — Z6841 Body Mass Index (BMI) 40.0 and over, adult: Secondary | ICD-10-CM

## 2015-02-23 DIAGNOSIS — I272 Other secondary pulmonary hypertension: Secondary | ICD-10-CM | POA: Diagnosis present

## 2015-02-23 DIAGNOSIS — I5021 Acute systolic (congestive) heart failure: Secondary | ICD-10-CM

## 2015-02-23 DIAGNOSIS — J440 Chronic obstructive pulmonary disease with acute lower respiratory infection: Secondary | ICD-10-CM | POA: Diagnosis present

## 2015-02-23 DIAGNOSIS — Z8249 Family history of ischemic heart disease and other diseases of the circulatory system: Secondary | ICD-10-CM

## 2015-02-23 DIAGNOSIS — J441 Chronic obstructive pulmonary disease with (acute) exacerbation: Secondary | ICD-10-CM | POA: Diagnosis present

## 2015-02-23 DIAGNOSIS — K219 Gastro-esophageal reflux disease without esophagitis: Secondary | ICD-10-CM | POA: Diagnosis present

## 2015-02-23 DIAGNOSIS — R739 Hyperglycemia, unspecified: Secondary | ICD-10-CM | POA: Diagnosis present

## 2015-02-23 HISTORY — DX: Gastrointestinal hemorrhage, unspecified: K92.2

## 2015-02-23 HISTORY — DX: Gastro-esophageal reflux disease without esophagitis: K21.9

## 2015-02-23 HISTORY — DX: Bradycardia, unspecified: R00.1

## 2015-02-23 LAB — CBC WITH DIFFERENTIAL/PLATELET
BASOS ABS: 0 10*3/uL (ref 0.0–0.1)
Basophils Relative: 0 % (ref 0–1)
EOS PCT: 2 % (ref 0–5)
Eosinophils Absolute: 0.1 10*3/uL (ref 0.0–0.7)
HCT: 34.8 % — ABNORMAL LOW (ref 36.0–46.0)
HEMOGLOBIN: 10 g/dL — AB (ref 12.0–15.0)
LYMPHS PCT: 13 % (ref 12–46)
Lymphs Abs: 0.8 10*3/uL (ref 0.7–4.0)
MCH: 26.8 pg (ref 26.0–34.0)
MCHC: 28.7 g/dL — AB (ref 30.0–36.0)
MCV: 93.3 fL (ref 78.0–100.0)
MONO ABS: 0.5 10*3/uL (ref 0.1–1.0)
MONOS PCT: 8 % (ref 3–12)
Neutro Abs: 4.8 10*3/uL (ref 1.7–7.7)
Neutrophils Relative %: 77 % (ref 43–77)
Platelets: 182 10*3/uL (ref 150–400)
RBC: 3.73 MIL/uL — AB (ref 3.87–5.11)
RDW: 15.4 % (ref 11.5–15.5)
WBC: 6.3 10*3/uL (ref 4.0–10.5)

## 2015-02-23 LAB — BASIC METABOLIC PANEL
Anion gap: 7 (ref 5–15)
BUN: 17 mg/dL (ref 6–23)
CHLORIDE: 103 mmol/L (ref 96–112)
CO2: 32 mmol/L (ref 19–32)
Calcium: 8.7 mg/dL (ref 8.4–10.5)
Creatinine, Ser: 1.07 mg/dL (ref 0.50–1.10)
GFR calc Af Amer: 63 mL/min — ABNORMAL LOW (ref >90)
GFR calc non Af Amer: 54 mL/min — ABNORMAL LOW (ref >90)
Glucose, Bld: 164 mg/dL — ABNORMAL HIGH (ref 70–99)
POTASSIUM: 3.8 mmol/L (ref 3.5–5.1)
Sodium: 142 mmol/L (ref 135–145)

## 2015-02-23 LAB — BRAIN NATRIURETIC PEPTIDE: B NATRIURETIC PEPTIDE 5: 119.4 pg/mL — AB (ref 0.0–100.0)

## 2015-02-23 LAB — TROPONIN I
TROPONIN I: 0.03 ng/mL (ref <0.031)
Troponin I: 0.03 ng/mL (ref <0.031)

## 2015-02-23 MED ORDER — IOHEXOL 300 MG/ML  SOLN
25.0000 mL | Freq: Once | INTRAMUSCULAR | Status: AC | PRN
  Administered 2015-02-23: 25 mL via ORAL

## 2015-02-23 MED ORDER — IPRATROPIUM-ALBUTEROL 0.5-2.5 (3) MG/3ML IN SOLN
3.0000 mL | Freq: Once | RESPIRATORY_TRACT | Status: AC
  Administered 2015-02-23: 3 mL via RESPIRATORY_TRACT
  Filled 2015-02-23: qty 3

## 2015-02-23 MED ORDER — IOHEXOL 300 MG/ML  SOLN
100.0000 mL | Freq: Once | INTRAMUSCULAR | Status: AC | PRN
  Administered 2015-02-23: 100 mL via INTRAVENOUS

## 2015-02-23 MED ORDER — IPRATROPIUM-ALBUTEROL 0.5-2.5 (3) MG/3ML IN SOLN
3.0000 mL | RESPIRATORY_TRACT | Status: DC
  Administered 2015-02-23 – 2015-02-27 (×21): 3 mL via RESPIRATORY_TRACT
  Filled 2015-02-23 (×21): qty 3

## 2015-02-23 NOTE — ED Notes (Signed)
Pt return from CT scan with increased SOB lungs decreased bilat bases pt reports feeling tight with breathing ,RT asked to evaluate for possible aersol Tx

## 2015-02-23 NOTE — ED Notes (Addendum)
Pt st sshe has been short of breath since this morning. Pt is on O2 at 2lpm at home. Pt also c/o a sore spot on her lower abd x1 week.

## 2015-02-23 NOTE — ED Notes (Signed)
[facility]

## 2015-02-23 NOTE — ED Provider Notes (Signed)
CSN: [id number]     Arrival date & time 02/23/15  1829 History  This chart was scribed for Mirian Mo, MD by Abel Presto, ED Scribe. This patient was seen in room MH05/MH05 and the patient's care was started at 6:50 PM.      Chief Complaint  Patient presents with  . Shortness of Breath     Patient is a 64 y.o. female presenting with shortness of breath.  Shortness of Breath Severity:  Moderate Onset quality:  Gradual Duration:  1 day Timing:  Constant Progression:  Unchanged Chronicity:  Recurrent Context comment:  COPD, recent malaise with sick contacts with URI symtpoms Relieved by:  Nothing Worsened by:  Exertion and movement Ineffective treatments:  None tried Associated symptoms: chest pain (pressure), cough and rash   Associated symptoms: no fever and no syncope    HPI Comments: Rebecca Welch is a 64 y.o. female with PMHx of HTN, asthma, CHF, atrial flutter, and CVA who presents to the Emergency Department complaining of SOB with onset this morning. Pt notes associated cough yesterday, wheezing and fatigue. Pt complains of tender area of skin on abdomen with associated redness which has resolved. Pt was started on a Z-pack by a pulmonologist 6 days ago for chest tightness. Pt is on 2 L O2 at home. Pt notes recent sick contacts. Pt denies fever and any other complaints at this time.   Past Medical History  Diagnosis Date  . Hypertension   . Asthma   . CHF (congestive heart failure)   . Atrial flutter   . Obesity   . Stroke    Past Surgical History  Procedure Laterality Date  . Appendectomy    . Bladder repair    . Ovary surgery    . Pacemaker insertion     No family history on file. History  Substance Use Topics  . Smoking status: Former Games developer  . Smokeless tobacco: Not on file  . Alcohol Use: No   OB History    No data available     Review of Systems  Constitutional: Negative for fever.  Respiratory: Positive for cough and shortness of breath.    Cardiovascular: Positive for chest pain (pressure). Negative for syncope.  Skin: Positive for rash.  All other systems reviewed and are negative.     Allergies  Review of patient's allergies indicates no known allergies.  Home Medications   Prior to Admission medications   Medication Sig Start Date End Date Taking? Authorizing Provider  aspirin 81 MG tablet Take 81 mg by mouth daily.   Yes Historical Provider, MD  budesonide-formoterol (SYMBICORT) 160-4.5 MCG/ACT inhaler Inhale 2 puffs into the lungs 2 (two) times daily.   Yes Historical Provider, MD  flecainide (TAMBOCOR) 100 MG tablet Take 100 mg by mouth 2 (two) times daily.   Yes Historical Provider, MD  furosemide (LASIX) 20 MG tablet Take 80 mg by mouth daily.    Yes Historical Provider, MD  losartan (COZAAR) 50 MG tablet Take 50 mg by mouth daily.   Yes Historical Provider, MD  pantoprazole (PROTONIX) 40 MG tablet Take 40 mg by mouth daily.   Yes Historical Provider, MD  diltiazem (CARDIZEM) 120 MG tablet Take 120 mg by mouth 1 day or 1 dose.    Historical Provider, MD  HYDROcodone-acetaminophen (NORCO/VICODIN) 5-325 MG per tablet Take 1 tablet by mouth every 4 (four) hours as needed. 07/27/14   Rolland Porter, MD  levofloxacin (LEVAQUIN) 500 MG tablet Take 1 tablet (500 mg total)  by mouth daily. 07/27/14   Rolland PorterMark James, MD  methocarbamol (ROBAXIN) 500 MG tablet Take 1 tablet (500 mg total) by mouth 3 (three) times daily between meals as needed. 07/27/14   Rolland PorterMark James, MD  olmesartan-hydrochlorothiazide (BENICAR HCT) 40-12.5 MG per tablet Take 1 tablet by mouth daily.    Historical Provider, MD  warfarin (COUMADIN) 1 MG tablet Take 1 mg by mouth as directed.    Historical Provider, MD   BP 119/71 mmHg  Pulse 75  Temp(Src) 99.1 F (37.3 C) (Oral)  Resp 12  Wt 346 lb (156.945 kg)  SpO2 94% Physical Exam  Constitutional: She is oriented to person, place, and time. She appears well-developed and well-nourished.  HENT:  Head:  Normocephalic and atraumatic.  Right Ear: External ear normal.  Left Ear: External ear normal.  Eyes: Conjunctivae and EOM are normal. Pupils are equal, round, and reactive to light.  Neck: Normal range of motion. Neck supple.  Cardiovascular: Normal rate, regular rhythm, normal heart sounds and intact distal pulses.   Pulmonary/Chest: Effort normal. She has wheezes (diffusely).  Abdominal: Soft. Bowel sounds are normal. There is tenderness.  Mild tenderness and swelling to patchy areas of bil lower abd  Musculoskeletal: Normal range of motion.  Neurological: She is alert and oriented to person, place, and time.  Skin: Skin is warm and dry.  Vitals reviewed.   ED Course  Procedures (including critical care time) DIAGNOSTIC STUDIES: Oxygen Saturation is 92% on 3L of O2, adequate by my interpretation.    COORDINATION OF CARE: 6:55 PM Discussed treatment plan with patient at beside, the patient agrees with the plan and has no further questions at this time.   Labs Review Labs Reviewed  BASIC METABOLIC PANEL - Abnormal; Notable for the following:    Glucose, Bld 164 (*)    GFR calc non Af Amer 54 (*)    GFR calc Af Amer 63 (*)    All other components within normal limits  CBC WITH DIFFERENTIAL/PLATELET - Abnormal; Notable for the following:    RBC 3.73 (*)    Hemoglobin 10.0 (*)    HCT 34.8 (*)    MCHC 28.7 (*)    All other components within normal limits  BRAIN NATRIURETIC PEPTIDE - Abnormal; Notable for the following:    B Natriuretic Peptide 119.4 (*)    All other components within normal limits  TROPONIN I  TROPONIN I  PROTIME-INR  MAGNESIUM    Imaging Review Dg Chest 2 View  02/23/2015   CLINICAL DATA:  Shortness of breath, history of atrial flutter  EXAM: CHEST  2 VIEW  COMPARISON:  07/27/2014  FINDINGS: There is mild bilateral interstitial thickening. There is no pleural effusion or pneumothorax. There is a dual lead cardiac pacer. There is stable cardiomegaly.  The  osseous structures are unremarkable.  IMPRESSION: Cardiomegaly with mild pulmonary vascular congestion.   Electronically Signed   By: Elige KoHetal  Patel   On: 02/23/2015 19:36   Ct Abdomen Pelvis W Contrast  02/23/2015   CLINICAL DATA:  Lower abdomen pain for 2-3 weeks with nausea and diarrhea  EXAM: CT ABDOMEN AND PELVIS WITH CONTRAST  TECHNIQUE: Multidetector CT imaging of the abdomen and pelvis was performed using the standard protocol following bolus administration of intravenous contrast.  CONTRAST:  25mL OMNIPAQUE IOHEXOL 300 MG/ML SOLN, 100mL OMNIPAQUE IOHEXOL 300 MG/ML SOLN  COMPARISON:  None.  FINDINGS: There is diffuse fatty infiltration of liver. No focal liver lesion is identified. The spleen, pancreas, gallbladder,  adrenal glands are normal. There are simple cysts in both kidneys, largest in the posterior midpole right kidney measuring 2.6 x 3.7 cm. There is no hydronephrosis bilaterally. There is atherosclerosis of the abdominal aorta without aneurysmal dilatation. There is no abdominal lymphadenopathy. There is venous phleboliths anterior to the mid to distal right ureter.  There is no small bowel obstruction. There is a small hiatal hernia. There is midline anterior herniation of mesenteric fat and colon in the pelvis without evidence of bowel incarceration or obstruction. There is subcutaneous fat stranding of the anterior lower pelvis.  Images of the pelvis demonstrate partial decompressed bladder. There is question enlargement of the right ovary measuring 3.3 x 7.3 cm. There is consolidation of the posterior right lower lobe. Degenerative joint changes of the spine are noted.  IMPRESSION: No acute abnormality is identified.  There is midline anterior herniation mesenteric fat and colon in the pelvis without evidence of bowel incarceration or obstruction.  Question enlargement of right ovary for patient age. Recommend further evaluation with pelvic ultrasound on outpatient basis.   Electronically  Signed   By: Sherian Rein M.D.   On: 02/23/2015 22:08     EKG Interpretation   Date/Time:  Monday February 23 2015 19:33:01 EDT Ventricular Rate:  69 PR Interval:    QRS Duration: 180 QT Interval:  460 QTC Calculation: 492 R Axis:   -62 Text Interpretation:  Ventricular-paced rhythm Abnormal ECG No significant  change since last tracing Confirmed by Mirian Mo [phone] on  02/23/2015 8:54:26 PM     Results for orders placed or performed during the hospital encounter of 02/23/15  Basic metabolic panel  Result Value Ref Range   Sodium 142 135 - 145 mmol/L   Potassium 3.8 3.5 - 5.1 mmol/L   Chloride 103 96 - 112 mmol/L   CO2 32 19 - 32 mmol/L   Glucose, Bld 164 (H) 70 - 99 mg/dL   BUN 17 6 - 23 mg/dL   Creatinine, Ser 6.04 0.50 - 1.10 mg/dL   Calcium 8.7 8.4 - 54.0 mg/dL   GFR calc non Af Amer 54 (L) >90 mL/min   GFR calc Af Amer 63 (L) >90 mL/min   Anion gap 7 5 - 15  CBC with Differential  Result Value Ref Range   WBC 6.3 4.0 - 10.5 K/uL   RBC 3.73 (L) 3.87 - 5.11 MIL/uL   Hemoglobin 10.0 (L) 12.0 - 15.0 g/dL   HCT 98.1 (L) 19.1 - 47.8 %   MCV 93.3 78.0 - 100.0 fL   MCH 26.8 26.0 - 34.0 pg   MCHC 28.7 (L) 30.0 - 36.0 g/dL   RDW 29.5 62.1 - 30.8 %   Platelets 182 150 - 400 K/uL   Neutrophils Relative % 77 43 - 77 %   Neutro Abs 4.8 1.7 - 7.7 K/uL   Lymphocytes Relative 13 12 - 46 %   Lymphs Abs 0.8 0.7 - 4.0 K/uL   Monocytes Relative 8 3 - 12 %   Monocytes Absolute 0.5 0.1 - 1.0 K/uL   Eosinophils Relative 2 0 - 5 %   Eosinophils Absolute 0.1 0.0 - 0.7 K/uL   Basophils Relative 0 0 - 1 %   Basophils Absolute 0.0 0.0 - 0.1 K/uL  Brain natriuretic peptide  Result Value Ref Range   B Natriuretic Peptide 119.4 (H) 0.0 - 100.0 pg/mL  Troponin I  Result Value Ref Range   Troponin I 0.03 <0.031 ng/mL  Troponin I  Result  Value Ref Range   Troponin I 0.03 <0.031 ng/mL   Dg Chest 2 View  02/23/2015   CLINICAL DATA:  Shortness of breath, history of atrial flutter   EXAM: CHEST  2 VIEW  COMPARISON:  07/27/2014  FINDINGS: There is mild bilateral interstitial thickening. There is no pleural effusion or pneumothorax. There is a dual lead cardiac pacer. There is stable cardiomegaly.  The osseous structures are unremarkable.  IMPRESSION: Cardiomegaly with mild pulmonary vascular congestion.   Electronically Signed   By: Elige Ko   On: 02/23/2015 19:36   Ct Abdomen Pelvis W Contrast  02/23/2015   CLINICAL DATA:  Lower abdomen pain for 2-3 weeks with nausea and diarrhea  EXAM: CT ABDOMEN AND PELVIS WITH CONTRAST  TECHNIQUE: Multidetector CT imaging of the abdomen and pelvis was performed using the standard protocol following bolus administration of intravenous contrast.  CONTRAST:  25mL OMNIPAQUE IOHEXOL 300 MG/ML SOLN, OMNIPAQUE IOHEXOL 300 MG/ML SOLN  COMPARISON:  None.  FINDINGS: There is diffuse fatty infiltration of liver. No focal liver lesion is identified. The spleen, pancreas, gallbladder, adrenal glands are normal. There are simple cysts in both kidneys, largest in the posterior midpole right kidney measuring 2.6 x 3.7 cm. There is no hydronephrosis bilaterally. There is atherosclerosis of the abdominal aorta without aneurysmal dilatation. There is no abdominal lymphadenopathy. There is venous phleboliths anterior to the mid to distal right ureter.  There is no small bowel obstruction. There is a small hiatal hernia. There is midline anterior herniation of mesenteric fat and colon in the pelvis without evidence of bowel incarceration or obstruction. There is subcutaneous fat stranding of the anterior lower pelvis.  Images of the pelvis demonstrate partial decompressed bladder. There is question enlargement of the right ovary measuring 3.3 x 7.3 cm. There is consolidation of the posterior right lower lobe. Degenerative joint changes of the spine are noted.  IMPRESSION: No acute abnormality is identified.  There is midline anterior herniation mesenteric fat and  colon in the pelvis without evidence of bowel incarceration or obstruction.  Question enlargement of right ovary for patient age. Recommend further evaluation with pelvic ultrasound on outpatient basis.   Electronically Signed   By: Sherian Rein M.D.   On: 02/23/2015 22:08     MDM   Final diagnoses:  Abdominal pain  Dyspnea    64 y.o. female with pertinent PMH of COPD, CHF, aflutter presents with dyspnea and chest pain as above.  On arrival vital signs and physical exam as above. Patient was given duoneb with improvement. CT scan of abd obtained, which did not demonstrate acute pathology.  No signs of cellulitis on exam currently.    Pt had 17 beat run of vtach in setting of pacer in place.  She had increase and reproduction of symptoms during this time.  Dorna Bloom otherwise unremarkable.  Consulted hospitalist for admission, with likely inpt cards consult.  Feel history more multifactorial and fitting of medicine admission.     I have reviewed all laboratory and imaging studies if ordered as above  1. Dyspnea   2. Abdominal pain           Mirian Mo, MD 02/24/15 [phone]

## 2015-02-23 NOTE — ED Notes (Signed)
MD at bedside. 

## 2015-02-24 ENCOUNTER — Encounter (HOSPITAL_COMMUNITY): Payer: Self-pay | Admitting: Internal Medicine

## 2015-02-24 DIAGNOSIS — T380X5A Adverse effect of glucocorticoids and synthetic analogues, initial encounter: Secondary | ICD-10-CM | POA: Diagnosis present

## 2015-02-24 DIAGNOSIS — R109 Unspecified abdominal pain: Secondary | ICD-10-CM | POA: Diagnosis present

## 2015-02-24 DIAGNOSIS — J45901 Unspecified asthma with (acute) exacerbation: Secondary | ICD-10-CM | POA: Diagnosis present

## 2015-02-24 DIAGNOSIS — I482 Chronic atrial fibrillation: Secondary | ICD-10-CM | POA: Diagnosis present

## 2015-02-24 DIAGNOSIS — I4891 Unspecified atrial fibrillation: Secondary | ICD-10-CM | POA: Diagnosis not present

## 2015-02-24 DIAGNOSIS — Z8673 Personal history of transient ischemic attack (TIA), and cerebral infarction without residual deficits: Secondary | ICD-10-CM | POA: Diagnosis not present

## 2015-02-24 DIAGNOSIS — J441 Chronic obstructive pulmonary disease with (acute) exacerbation: Secondary | ICD-10-CM | POA: Diagnosis present

## 2015-02-24 DIAGNOSIS — J440 Chronic obstructive pulmonary disease with acute lower respiratory infection: Secondary | ICD-10-CM | POA: Diagnosis present

## 2015-02-24 DIAGNOSIS — E669 Obesity, unspecified: Secondary | ICD-10-CM | POA: Diagnosis present

## 2015-02-24 DIAGNOSIS — I5033 Acute on chronic diastolic (congestive) heart failure: Secondary | ICD-10-CM | POA: Diagnosis present

## 2015-02-24 DIAGNOSIS — R0600 Dyspnea, unspecified: Secondary | ICD-10-CM | POA: Diagnosis present

## 2015-02-24 DIAGNOSIS — Z9981 Dependence on supplemental oxygen: Secondary | ICD-10-CM | POA: Diagnosis not present

## 2015-02-24 DIAGNOSIS — R1032 Left lower quadrant pain: Secondary | ICD-10-CM | POA: Diagnosis not present

## 2015-02-24 DIAGNOSIS — I4892 Unspecified atrial flutter: Secondary | ICD-10-CM | POA: Diagnosis present

## 2015-02-24 DIAGNOSIS — I272 Other secondary pulmonary hypertension: Secondary | ICD-10-CM | POA: Diagnosis present

## 2015-02-24 DIAGNOSIS — Z7982 Long term (current) use of aspirin: Secondary | ICD-10-CM | POA: Diagnosis not present

## 2015-02-24 DIAGNOSIS — N179 Acute kidney failure, unspecified: Secondary | ICD-10-CM | POA: Diagnosis not present

## 2015-02-24 DIAGNOSIS — R1310 Dysphagia, unspecified: Secondary | ICD-10-CM | POA: Diagnosis not present

## 2015-02-24 DIAGNOSIS — I483 Typical atrial flutter: Secondary | ICD-10-CM

## 2015-02-24 DIAGNOSIS — Z950 Presence of cardiac pacemaker: Secondary | ICD-10-CM | POA: Diagnosis not present

## 2015-02-24 DIAGNOSIS — R0602 Shortness of breath: Secondary | ICD-10-CM | POA: Diagnosis present

## 2015-02-24 DIAGNOSIS — I10 Essential (primary) hypertension: Secondary | ICD-10-CM | POA: Diagnosis present

## 2015-02-24 DIAGNOSIS — R739 Hyperglycemia, unspecified: Secondary | ICD-10-CM | POA: Diagnosis present

## 2015-02-24 DIAGNOSIS — Z87891 Personal history of nicotine dependence: Secondary | ICD-10-CM | POA: Diagnosis not present

## 2015-02-24 DIAGNOSIS — T501X5A Adverse effect of loop [high-ceiling] diuretics, initial encounter: Secondary | ICD-10-CM | POA: Diagnosis present

## 2015-02-24 DIAGNOSIS — R1084 Generalized abdominal pain: Secondary | ICD-10-CM | POA: Diagnosis not present

## 2015-02-24 DIAGNOSIS — Z8249 Family history of ischemic heart disease and other diseases of the circulatory system: Secondary | ICD-10-CM | POA: Diagnosis not present

## 2015-02-24 DIAGNOSIS — Z833 Family history of diabetes mellitus: Secondary | ICD-10-CM | POA: Diagnosis not present

## 2015-02-24 DIAGNOSIS — K219 Gastro-esophageal reflux disease without esophagitis: Secondary | ICD-10-CM | POA: Diagnosis present

## 2015-02-24 DIAGNOSIS — J209 Acute bronchitis, unspecified: Secondary | ICD-10-CM | POA: Diagnosis present

## 2015-02-24 DIAGNOSIS — I639 Cerebral infarction, unspecified: Secondary | ICD-10-CM | POA: Diagnosis present

## 2015-02-24 DIAGNOSIS — K210 Gastro-esophageal reflux disease with esophagitis: Secondary | ICD-10-CM

## 2015-02-24 DIAGNOSIS — R6511 Systemic inflammatory response syndrome (SIRS) of non-infectious origin with acute organ dysfunction: Secondary | ICD-10-CM | POA: Diagnosis present

## 2015-02-24 DIAGNOSIS — I481 Persistent atrial fibrillation: Secondary | ICD-10-CM | POA: Diagnosis not present

## 2015-02-24 DIAGNOSIS — J4521 Mild intermittent asthma with (acute) exacerbation: Secondary | ICD-10-CM | POA: Diagnosis not present

## 2015-02-24 DIAGNOSIS — D509 Iron deficiency anemia, unspecified: Secondary | ICD-10-CM | POA: Diagnosis present

## 2015-02-24 DIAGNOSIS — Z6841 Body Mass Index (BMI) 40.0 and over, adult: Secondary | ICD-10-CM | POA: Diagnosis not present

## 2015-02-24 DIAGNOSIS — J9621 Acute and chronic respiratory failure with hypoxia: Secondary | ICD-10-CM | POA: Diagnosis present

## 2015-02-24 DIAGNOSIS — G4733 Obstructive sleep apnea (adult) (pediatric): Secondary | ICD-10-CM | POA: Diagnosis present

## 2015-02-24 DIAGNOSIS — Z79899 Other long term (current) drug therapy: Secondary | ICD-10-CM | POA: Diagnosis not present

## 2015-02-24 DIAGNOSIS — Z7901 Long term (current) use of anticoagulants: Secondary | ICD-10-CM | POA: Diagnosis not present

## 2015-02-24 DIAGNOSIS — I509 Heart failure, unspecified: Secondary | ICD-10-CM

## 2015-02-24 LAB — RAPID URINE DRUG SCREEN, HOSP PERFORMED
Amphetamines: NOT DETECTED
BARBITURATES: NOT DETECTED
Benzodiazepines: NOT DETECTED
COCAINE: NOT DETECTED
Opiates: NOT DETECTED
TETRAHYDROCANNABINOL: NOT DETECTED

## 2015-02-24 LAB — INFLUENZA PANEL BY PCR (TYPE A & B)
H1N1FLUPCR: NOT DETECTED
INFLBPCR: NEGATIVE
Influenza A By PCR: NEGATIVE

## 2015-02-24 LAB — HIV ANTIBODY (ROUTINE TESTING W REFLEX): HIV Screen 4th Generation wRfx: NONREACTIVE

## 2015-02-24 LAB — PROTIME-INR
INR: 1.19 (ref 0.00–1.49)
Prothrombin Time: 15.1 seconds (ref 11.6–15.2)

## 2015-02-24 LAB — URINALYSIS, ROUTINE W REFLEX MICROSCOPIC
Bilirubin Urine: NEGATIVE
Glucose, UA: NEGATIVE mg/dL
Hgb urine dipstick: NEGATIVE
Ketones, ur: NEGATIVE mg/dL
LEUKOCYTES UA: NEGATIVE
Nitrite: NEGATIVE
PH: 5.5 (ref 5.0–8.0)
Protein, ur: NEGATIVE mg/dL
SPECIFIC GRAVITY, URINE: 1.01 (ref 1.005–1.030)
UROBILINOGEN UA: 1 mg/dL (ref 0.0–1.0)

## 2015-02-24 LAB — LIPASE, BLOOD: Lipase: 43 U/L (ref 11–59)

## 2015-02-24 LAB — TROPONIN I
Troponin I: 0.05 ng/mL — ABNORMAL HIGH (ref <0.031)
Troponin I: 0.03 ng/mL (ref <0.031)
Troponin I: 0.03 ng/mL (ref <0.031)

## 2015-02-24 LAB — LACTIC ACID, PLASMA: LACTIC ACID, VENOUS: 3.9 mmol/L — AB (ref 0.5–2.0)

## 2015-02-24 LAB — STREP PNEUMONIAE URINARY ANTIGEN: Strep Pneumo Urinary Antigen: NEGATIVE

## 2015-02-24 LAB — MAGNESIUM: Magnesium: 2 mg/dL (ref 1.5–2.5)

## 2015-02-24 MED ORDER — HYDROCHLOROTHIAZIDE 12.5 MG PO CAPS
12.5000 mg | ORAL_CAPSULE | Freq: Every day | ORAL | Status: DC
  Administered 2015-02-24: 12.5 mg via ORAL
  Filled 2015-02-24: qty 1

## 2015-02-24 MED ORDER — METHOCARBAMOL 500 MG PO TABS
500.0000 mg | ORAL_TABLET | Freq: Three times a day (TID) | ORAL | Status: DC | PRN
  Filled 2015-02-24: qty 1

## 2015-02-24 MED ORDER — METHYLPREDNISOLONE SODIUM SUCC 125 MG IJ SOLR
80.0000 mg | Freq: Three times a day (TID) | INTRAMUSCULAR | Status: DC
  Administered 2015-02-24 (×2): 80 mg via INTRAVENOUS
  Filled 2015-02-24: qty 2

## 2015-02-24 MED ORDER — FLECAINIDE ACETATE 100 MG PO TABS: 100.0000 mg | ORAL_TABLET | Freq: Two times a day (BID) | ORAL | Status: DC

## 2015-02-24 MED ORDER — HEPARIN SODIUM (PORCINE) 5000 UNIT/ML IJ SOLN
5000.0000 [IU] | Freq: Three times a day (TID) | INTRAMUSCULAR | Status: DC
  Administered 2015-02-25: 5000 [IU] via SUBCUTANEOUS
  Filled 2015-02-24 (×27): qty 1

## 2015-02-24 MED ORDER — IRBESARTAN 300 MG PO TABS
300.0000 mg | ORAL_TABLET | Freq: Every day | ORAL | Status: DC
  Administered 2015-02-24: 300 mg via ORAL
  Filled 2015-02-24: qty 1

## 2015-02-24 MED ORDER — ASPIRIN EC 81 MG PO TBEC
81.0000 mg | DELAYED_RELEASE_TABLET | Freq: Every day | ORAL | Status: DC
  Administered 2015-02-24 – 2015-03-04 (×9): 81 mg via ORAL
  Filled 2015-02-24 (×9): qty 1

## 2015-02-24 MED ORDER — FLECAINIDE ACETATE 50 MG PO TABS
50.0000 mg | ORAL_TABLET | Freq: Two times a day (BID) | ORAL | Status: DC
  Administered 2015-02-24 – 2015-02-28 (×9): 50 mg via ORAL
  Filled 2015-02-24 (×12): qty 1

## 2015-02-24 MED ORDER — CETYLPYRIDINIUM CHLORIDE 0.05 % MT LIQD
7.0000 mL | Freq: Two times a day (BID) | OROMUCOSAL | Status: DC
  Administered 2015-02-24 – 2015-03-04 (×14): 7 mL via OROMUCOSAL

## 2015-02-24 MED ORDER — FUROSEMIDE 10 MG/ML IJ SOLN
80.0000 mg | Freq: Once | INTRAMUSCULAR | Status: AC
  Administered 2015-02-24: 80 mg via INTRAVENOUS
  Filled 2015-02-24: qty 8

## 2015-02-24 MED ORDER — FUROSEMIDE 80 MG PO TABS
80.0000 mg | ORAL_TABLET | Freq: Every day | ORAL | Status: DC
  Administered 2015-02-24: 80 mg via ORAL
  Filled 2015-02-24: qty 1

## 2015-02-24 MED ORDER — OLMESARTAN MEDOXOMIL-HCTZ 40-12.5 MG PO TABS: 1.0000 | ORAL_TABLET | Freq: Every day | ORAL | Status: DC

## 2015-02-24 MED ORDER — PANTOPRAZOLE SODIUM 40 MG PO TBEC
40.0000 mg | DELAYED_RELEASE_TABLET | Freq: Every day | ORAL | Status: DC
  Administered 2015-02-24 – 2015-03-04 (×9): 40 mg via ORAL
  Filled 2015-02-24 (×8): qty 1

## 2015-02-24 MED ORDER — DM-GUAIFENESIN ER 30-600 MG PO TB12
1.0000 | ORAL_TABLET | Freq: Two times a day (BID) | ORAL | Status: DC
  Administered 2015-02-24 – 2015-03-04 (×17): 1 via ORAL
  Filled 2015-02-24 (×19): qty 1

## 2015-02-24 MED ORDER — HYDROCODONE-ACETAMINOPHEN 5-325 MG PO TABS
1.0000 | ORAL_TABLET | ORAL | Status: DC | PRN
  Administered 2015-02-26 – 2015-03-03 (×9): 1 via ORAL
  Filled 2015-02-24 (×9): qty 1

## 2015-02-24 MED ORDER — ALBUTEROL SULFATE (2.5 MG/3ML) 0.083% IN NEBU: 2.5000 mg | INHALATION_SOLUTION | RESPIRATORY_TRACT | Status: DC | PRN

## 2015-02-24 MED ORDER — DILTIAZEM HCL 60 MG PO TABS: 120.0000 mg | ORAL_TABLET | ORAL | Status: DC

## 2015-02-24 MED ORDER — FUROSEMIDE 10 MG/ML IJ SOLN
40.0000 mg | Freq: Two times a day (BID) | INTRAMUSCULAR | Status: DC
  Administered 2015-02-24 – 2015-02-25 (×2): 40 mg via INTRAVENOUS
  Filled 2015-02-24 (×4): qty 4

## 2015-02-24 MED ORDER — METHYLPREDNISOLONE SODIUM SUCC 125 MG IJ SOLR
60.0000 mg | Freq: Four times a day (QID) | INTRAMUSCULAR | Status: DC
  Administered 2015-02-24 – 2015-02-25 (×4): 60 mg via INTRAVENOUS
  Filled 2015-02-24: qty 0.96
  Filled 2015-02-24: qty 2
  Filled 2015-02-24 (×3): qty 0.96
  Filled 2015-02-24: qty 2
  Filled 2015-02-24: qty 0.96

## 2015-02-24 MED ORDER — LEVOFLOXACIN 500 MG PO TABS
500.0000 mg | ORAL_TABLET | Freq: Every day | ORAL | Status: DC
  Administered 2015-02-24 – 2015-03-03 (×8): 500 mg via ORAL
  Filled 2015-02-24 (×9): qty 1

## 2015-02-24 NOTE — ED Notes (Addendum)
Run of 14 beat VT Rebecca Rebecca Welch denies chest pain or increased SOB but states she had epigastric discomfort that went straight to her head that resolved quickly.  She reports that this happens all the time. EDP notified and came to re-evaluate Rebecca Welch condition.

## 2015-02-24 NOTE — Progress Notes (Signed)
   02/24/15 0821  BiPAP/CPAP/SIPAP  BiPAP/CPAP/SIPAP Pt Type Adult  Mask Type Full face mask  Mask Size Large  Respiratory Rate 18 breaths/min  IPAP 16 cmH20  EPAP 6 cmH2O  Oxygen Percent 32 %  Flow Rate 3 lpm  BiPAP/CPAP/SIPAP BiPAP  Patient Home Equipment No  Auto Titrate No  Placed patient on Bipap per her comfort level and adjusted to 16/6 with 3lpm 02 bleed in.  Patient is tolerating well.

## 2015-02-24 NOTE — Progress Notes (Signed)
Patient ID: Rebecca Welch, female   DOB: Aug 30, 1951, 64 y.o.   MRN: [id number]  [facility] PROGRESS NOTE  Claude Swendsen VVZ:[id number] DOB: May 16, 1951 DOA: 02/23/2015 PCP: Marry Guan   Brief narrative:    64 y.o. female with HTN, GERD, pacemaker placement, CHF, atrial flutter (not on anticoagulation due to history of GI bleeding), history of GI bleeding, history of stroke, asthma, COPD, on home oxygen 2 L, who presented with several days duration of progressive dyspnea with exertion and at rest, associated with wheezing, non productive cough. She explained she was taking care of her grandson who was sick last week. Pt also reported LLQ pain over the past week with some nausea but no vomiting.  She has denied fever, chills, dysuria, urgency, frequency, hematuria, skin rashes. No unilateral weakness, numbness or tingling sensations. No vision change or hearing loss.  In ED, patient was hemodynamically stable, CXR notable for pulmonary vascular congestion, was found to have pulmonary congestion, CT abdomen/pelvis with non-incarcerated hernia, negative for other acute issues.  Assessment/Plan:    Principal Problem:   Acute hypoxic respiratory failure  - this appears to be be multifactorial and secondary to acute on chronic COPD with bronchitis, acute on chronic diastolic CHF - pt also with morbid obesity and likely OHS at baseline  - agree with empiric ABX Levaquin for now for presumptive bronchitis - also continue BD's scheduled and as needed - continue Lasix 40 mg IV BID  - provide oxygen via [place]    Acute on chronic diastolic CHF - no previous 2 D ECHO in the records - will request 2 D ECHO at this time - continue Lasix as noted above 40 mg IV BID - monitor daily weights, strict I's and O's - weight on admission 160.9kg   SIRS - criteria met on admission - pt with T 99.1 F, RR 24 bpm, BP 104/52.  - Levaquin started for presumptive bronchitis - will ask for lactic acid  and procalcitonin level  - requested UA - follow up on urine and blood cultures    Acute on chronic COPD - treatment with BD's scheduled and as needed - continue solumedrol and taper down as clinically indicated  - empiric ABX as noted above  Active Problems:   Hypertension - BP on low end of normal - hold HCTZ and Avapro for now until BP stabilizes - continue Lasix for CHF as noted above    Atrial flutter, rate controlled  - continue Flecainide - not AC candidate due to history of GI bleed - monitor on telemetry    Obesity, morbid - Body mass index is 46.81   GERD (gastroesophageal reflux disease) - continue Protonix    Abdominal pain - resolved    Anemia of chronic IDA - no signs of active bleeding - CBC in AM   Hyperglycemia, likely steroid induced  - check A1C  DVT prophylaxis - Heparin SQ  Code Status: Full.  Family Communication:  plan of care discussed with the patient Disposition Plan: Home when stable.   IV access:  Peripheral IV  Procedures and diagnostic studies:    CXR 02/23/2015   Cardiomegaly with mild pulmonary vascular congestion.     Ct Abdomen Pelvis W Contrast  02/23/2015  No acute abnormality is identified.  There is midline anterior herniation mesenteric fat and colon in the pelvis without evidence of bowel incarceration or obstruction.  Question enlargement of right ovary for patient age. Recommend further evaluation with pelvic ultrasound on outpatient basis.  Medical Consultants:  None   Other Consultants:  None   IAnti-Infectives:   Levaquin 3/33 -->  Faye Ramsay, MD  [facility] Pager [phone]  If 7PM-7AM, please contact night-coverage [email or web address] Password [facility] 02/24/2015, 3:22 PM   LOS: 0 days   HPI/Subjective: No events overnight.   Objective: Filed Vitals:   02/24/15 0509 02/24/15 0651 02/24/15 0811 02/24/15 1253  BP:    108/50  Pulse:      Temp:    98.9 F (37.2 C)  TempSrc:    Oral  Resp:    20  Height: _0   (1.854 m)     Weight: 160.9 kg (354 lb 11.5 oz)     SpO2:  98% 92% 95%    Intake/Output Summary (Last 24 hours) at 02/24/15 1522 Last data filed at 02/24/15 1253  Gross per 24 hour  Intake    260 ml  Output   1650 ml  Net  -1390 ml    Exam:   General:  Pt is alert, follows commands appropriately, not in acute distress, on CPAP  Cardiovascular: Regular rate and rhythm, no rubs, no gallops  Respiratory: Course breath sounds bilaterally with exp wheezing   Abdomen: Soft, non tender, non distended, bowel sounds present, no guarding  Extremities: +1 bilateral LE pitting edema, pulses DP and PT palpable bilaterally  Neuro: Grossly nonfocal  Data Reviewed: Basic Metabolic Panel:  Recent Labs Lab 02/23/15 1850 02/23/15 1914  NA 142  --   K 3.8  --   CL 103  --   CO2 32  --   GLUCOSE 164*  --   BUN 17  --   CREATININE 1.07  --   CALCIUM 8.7  --   MG  --  2.0    Recent Labs Lab 02/24/15 0715  LIPASE 43   CBC:  Recent Labs Lab 02/23/15 1850  WBC 6.3  NEUTROABS 4.8  HGB 10.0*  HCT 34.8*  MCV 93.3  PLT 182   Cardiac Enzymes:  Recent Labs Lab 02/23/15 1850 02/23/15 2320 02/24/15 0715 02/24/15 1130  TROPONINI 0.03 0.03 0.05* 0.03   Scheduled Meds: . antiseptic oral rinse  7 mL Mouth Rinse BID  . aspirin EC  81 mg Oral Daily  . dextromethorphan-guaiFENesin  1 tablet Oral BID  . flecainide  50 mg Oral BID  . furosemide  80 mg Oral Daily  . heparin  5,000 Units Subcutaneous 3 times per day  . hydrochlorothiazide  12.5 mg Oral Daily  . ipratropium-albuterol  3 mL Nebulization Q4H  . irbesartan  300 mg Oral Daily  . levofloxacin  500 mg Oral Daily  . methylPREDNISolone (SOLU-MEDROL) injection  80 mg Intravenous 3 times per day  . pantoprazole  40 mg Oral Daily   Continuous Infusions:

## 2015-02-24 NOTE — Progress Notes (Signed)
Tel alerts for the patient - VT. On assessment, patient denied any distress. MD notified. Will continue to monitor.

## 2015-02-24 NOTE — ED Notes (Signed)
[facility] condition stable

## 2015-02-24 NOTE — H&P (Signed)
[facility] History and Physical  Rebecca Welch KGM:[id number] DOB: 05/26/1951 DOA: 02/23/2015  Referring physician: ED physician PCP: Bernerd Limbo  Specialists:   Chief Complaint: Worsening shortness of breath and abdominal pain  HPI: Rebecca Welch is a 64 y.o. female with past medical history of hypertension, GERD, pacemaker placement, CHF, atrial flutter (not on anticoagulation due to history of GI bleeding), history of GI bleeding, history of stroke, asthma, COPD, on home oxygen 2 L, who presents with worsening shortness of breath and abdominal pain.  Patient has chronic shortness of breath due to COPD and asthma. Patient reports that she uses 2L oxygen at home at baseline. Since yesterday, her shortness of breath has been progressively getting worse. She has wheezing and very mild cough, no sputum production. Patient does not have chest pain. No tenderness over calf area. Of note, she has sick contact with her grandson who has cold recently.  Patient also reports having abdominal pain in the past 3 weeks. Abdominal pain is located in the left lower quadrant. It is moderate and associated with nausea, but no vomiting or diarrhea. Patient denies fever, chills, dysuria, urgency, frequency, hematuria, skin rashes. No unilateral weakness, numbness or tingling sensations. No vision change or hearing loss.  In ED, patient was found to have pulmonary congestion by chest x-ray, WBC 6.3, temperature 99.1, INR 1.19, troponin negative, BNP 119.4, electrolytes okay. CT abdomen/pelvis showed non-incarcerated hernia, but negative for other acute issues. Patient is admitted to inpatient for further evaluation and treatment.  Review of Systems: As presented in the history of presenting illness, rest negative.  Where does patient live?  At home Can patient participate in ADLs? Yes  Allergy: No Known Allergies  Past Medical History  Diagnosis Date  . Hypertension   . Asthma   . CHF  (congestive heart failure)   . Atrial flutter   . Obesity   . Stroke   . GERD (gastroesophageal reflux disease)   . GIB (gastrointestinal bleeding)     Past Surgical History  Procedure Laterality Date  . Appendectomy    . Bladder repair    . Ovary surgery    . Pacemaker insertion      Social History:  reports that she has quit smoking. She does not have any smokeless tobacco history on file. She reports that she does not drink alcohol or use illicit drugs.  Family History:  Family History  Problem Relation Age of Onset  . Diabetes Mother   . Hypertension Mother   . Heart attack Mother   . Heart attack Father   . Diabetes Brother      Prior to Admission medications   Medication Sig Start Date End Date Taking? Authorizing Provider  aspirin 81 MG tablet Take 81 mg by mouth daily.   Yes Historical Provider, MD  budesonide-formoterol (SYMBICORT) 160-4.5 MCG/ACT inhaler Inhale 2 puffs into the lungs 2 (two) times daily.   Yes Historical Provider, MD  flecainide (TAMBOCOR) 100 MG tablet Take 100 mg by mouth 2 (two) times daily.   Yes Historical Provider, MD  furosemide (LASIX) 20 MG tablet Take 80 mg by mouth daily.    Yes Historical Provider, MD  losartan (COZAAR) 50 MG tablet Take 50 mg by mouth daily.   Yes Historical Provider, MD  pantoprazole (PROTONIX) 40 MG tablet Take 40 mg by mouth daily.   Yes Historical Provider, MD  diltiazem (CARDIZEM) 120 MG tablet Take 120 mg by mouth 1 day or 1 dose.    Historical  Provider, MD  HYDROcodone-acetaminophen (NORCO/VICODIN) 5-325 MG per tablet Take 1 tablet by mouth every 4 (four) hours as needed. 07/27/14   Rolland PorterMark James, MD  levofloxacin (LEVAQUIN) 500 MG tablet Take 1 tablet (500 mg total) by mouth daily. 07/27/14   Rolland PorterMark James, MD  methocarbamol (ROBAXIN) 500 MG tablet Take 1 tablet (500 mg total) by mouth 3 (three) times daily between meals as needed. 07/27/14   Rolland PorterMark James, MD  olmesartan-hydrochlorothiazide (BENICAR HCT) 40-12.5 MG per  tablet Take 1 tablet by mouth daily.    Historical Provider, MD  warfarin (COUMADIN) 1 MG tablet Take 1 mg by mouth as directed.    Historical Provider, MD    Physical Exam: Filed Vitals:   02/24/15 0230 02/24/15 0258 02/24/15 0452 02/24/15 0509  BP: 122/70 122/70 136/76   Pulse: 69 69 68   Temp:   98.4 F (36.9 C)   TempSrc:   Oral   Resp:  20 18   Height:    6\' 1"  (1.854 m)  Weight:    160.9 kg (354 lb 11.5 oz)  SpO2: 97% 97% 100%    General: Not in acute distress. Morbid obesity HEENT:       Eyes: PERRL, EOMI, no scleral icterus       ENT: No discharge from the ears and nose, no pharynx injection, no tonsillar enlargement.        Neck: difficult to assess JVD due to morbid obesity, no bruit, no mass felt. Cardiac: S1/S2, RRR, No murmurs, No gallops or rubs Pulm: Diffused wheezing bilaterally  Abd: Soft, nondistended, mild tenderness over LLQ, no rebound pain, no organomegaly, BS present Ext: 1+ leg edema bilaterally. 2+DP/PT pulse bilaterally Musculoskeletal: No joint deformities, erythema, or stiffness, ROM full Skin: No rashes.  Neuro: Alert and oriented X3, cranial nerves II-XII grossly intact, muscle strength 5/5 in all extremeties, sensation to light touch intact.  Psych: Patient is not psychotic, no suicidal or hemocidal ideation.  Labs on Admission:  Basic Metabolic Panel:  Recent Labs Lab 02/23/15 1850 02/23/15 1914  NA 142  --   K 3.8  --   CL 103  --   CO2 32  --   GLUCOSE 164*  --   BUN 17  --   CREATININE 1.07  --   CALCIUM 8.7  --   MG  --  2.0   Liver Function Tests: No results for input(s): AST, ALT, ALKPHOS, BILITOT, PROT, ALBUMIN in the last 168 hours. No results for input(s): LIPASE, AMYLASE in the last 168 hours. No results for input(s): AMMONIA in the last 168 hours. CBC:  Recent Labs Lab 02/23/15 1850  WBC 6.3  NEUTROABS 4.8  HGB 10.0*  HCT 34.8*  MCV 93.3  PLT 182   Cardiac Enzymes:  Recent Labs Lab 02/23/15 1850  02/23/15 2320  TROPONINI 0.03 0.03    BNP (last 3 results)  Recent Labs  02/23/15 1850  BNP 119.4*    ProBNP (last 3 results) No results for input(s): PROBNP in the last 8760 hours.  CBG: No results for input(s): GLUCAP in the last 168 hours.  Radiological Exams on Admission: Dg Chest 2 View  02/23/2015   CLINICAL DATA:  Shortness of breath, history of atrial flutter  EXAM: CHEST  2 VIEW  COMPARISON:  07/27/2014  FINDINGS: There is mild bilateral interstitial thickening. There is no pleural effusion or pneumothorax. There is a dual lead cardiac pacer. There is stable cardiomegaly.  The osseous structures are unremarkable.  IMPRESSION:  Cardiomegaly with mild pulmonary vascular congestion.   Electronically Signed   By: Elige Ko   On: 02/23/2015 19:36   Ct Abdomen Pelvis W Contrast  02/23/2015   CLINICAL DATA:  Lower abdomen pain for 2-3 weeks with nausea and diarrhea  EXAM: CT ABDOMEN AND PELVIS WITH CONTRAST  TECHNIQUE: Multidetector CT imaging of the abdomen and pelvis was performed using the standard protocol following bolus administration of intravenous contrast.  CONTRAST:  25mL OMNIPAQUE IOHEXOL 300 MG/ML SOLN, OMNIPAQUE IOHEXOL 300 MG/ML SOLN  COMPARISON:  None.  FINDINGS: There is diffuse fatty infiltration of liver. No focal liver lesion is identified. The spleen, pancreas, gallbladder, adrenal glands are normal. There are simple cysts in both kidneys, largest in the posterior midpole right kidney measuring 2.6 x 3.7 cm. There is no hydronephrosis bilaterally. There is atherosclerosis of the abdominal aorta without aneurysmal dilatation. There is no abdominal lymphadenopathy. There is venous phleboliths anterior to the mid to distal right ureter.  There is no small bowel obstruction. There is a small hiatal hernia. There is midline anterior herniation of mesenteric fat and colon in the pelvis without evidence of bowel incarceration or obstruction. There is subcutaneous fat  stranding of the anterior lower pelvis.  Images of the pelvis demonstrate partial decompressed bladder. There is question enlargement of the right ovary measuring 3.3 x 7.3 cm. There is consolidation of the posterior right lower lobe. Degenerative joint changes of the spine are noted.  IMPRESSION: No acute abnormality is identified.  There is midline anterior herniation mesenteric fat and colon in the pelvis without evidence of bowel incarceration or obstruction.  Question enlargement of right ovary for patient age. Recommend further evaluation with pelvic ultrasound on outpatient basis.   Electronically Signed   By: Sherian Rein M.D.   On: 02/23/2015 22:08    EKG: Independently reviewed. QTC 492, old left bundle blockage  Assessment/Plan Principal Problem:   SOB (shortness of breath) Active Problems:   Dyspnea   Hypertension   CHF (congestive heart failure)   Atrial flutter   Obesity   Stroke   GERD (gastroesophageal reflux disease)   COPD exacerbation   Asthma with acute exacerbation   Abdominal pain  SOB: Most likely due to exacerbation of asthma and COPD, more likely due to asthma exacerbation given no productive cough, but has diffused wheezing. Another differential diagnosis is mildly exacerbated CHF. It is difficult to assess volume status given her morbid obesity, but her body weight has increased by 9 pounds per patient.   -will admit to tele bed -start levaquin orally - Urine legionella and S. pneumococcal antigen -Nebulizers: scheduled Duoneb and prn albuterol -Solu-Medrol 80 mg IV q8h  -Mucinex for cough  -Blood and sputum culture, respiratory viral panel plus Flu pcr -Urine drug screen, HIV -Urine legionella and S. pneumococcal antigen -Follow up blood culture x2, sputum culture, respiratory virus panel, Flu pcr  CHF: Not clear which type congestive heart failure. No 2-D echo on record.  Patient is taking Lasix 80 mg daily at home. Patient seems to have mildly  exacerbated CHF. Her body weight has increased by 9 pounds per patient. - give one dose of lasix 80 mg by IV -Continue home dose of Lasix 80 mg daily by oral -get 2d echo -Troponin 3  Abdominal pain: Etiology is not clear. Patient does not have diarrhea. CT abdomen/pelvis did not show acute abnormalities, except for nonincarcerated hernia. -Check lipase  A flutter: CHA2DS2-VASc Score is 5, need oral anticoagulation. Patient  used to be on coumadin which was discontinued because of GI bleeding twice. -will continue home flecainide, aspirin  Hypertension: -Irbesartan, hydrochlorothiazide -Discontinue home Cozaar since patient is already on another ARB  GERD:  -Protonix  OSA -CPAP  DVT ppx: SQ Heparin    Code Status: Full code Family Communication:  Yes, patient's  husband   at bed side Disposition Plan: Admit to inpatient   Date of Service 02/24/2015    Lorretta Harp [facility] Pager [phone]  If 7PM-7AM, please contact night-coverage [email or web address] Password [facility] 02/24/2015, 6:17 AM

## 2015-02-24 NOTE — Progress Notes (Signed)
CRITICAL VALUE ALERT  Critical value received:  Lactic Acid = 3.9  Date of notification:  02/24/2015  Time of notification:  1820  Critical value read back:Yes.    Nurse who received alert:  Lurena NidaGreta Abrial Arrighi  MD notified (1st page):  Dr. Izola PriceMyers  Time of first page:  1830  MD notified (2nd page):  Time of second page:  Responding MD:  MD aware  Time MD responded:

## 2015-02-24 NOTE — Evaluation (Signed)
Physical Therapy Evaluation Patient Details Name: Rebecca Welch MRN: [id number] DOB: 11/20/1951 Today's Date: 02/24/2015   History of Present Illness  pt is a 64 y/o female admitted with worsening SOB and abdominal pain.  Clinical Impression  Pt admitted with/for worsening SOB and abdominal pain.  Pt currently limited functionally due to the problems listed below.  (see problems list.)  Pt will benefit from PT to maximize function and safety to be able to get home safely with available assist of family.     Follow Up Recommendations Home health PT    Equipment Recommendations  None recommended by PT    Recommendations for Other Services       Precautions / Restrictions Restrictions Weight Bearing Restrictions: No      Mobility  Bed Mobility               General bed mobility comments: up in chair  Transfers Overall transfer level: Needs assistance Equipment used: None Transfers: Sit to/from Stand Sit to Stand: Supervision         General transfer comment: used momentum to get up out of the recliner safely  Ambulation/Gait Ambulation/Gait assistance: Min guard Ambulation Distance (Feet): 60 Feet Assistive device: None Gait Pattern/deviations: Step-through pattern Gait velocity: slower   General Gait Details: generally steady, but wide BOS  Stairs            Wheelchair Mobility    Modified Rankin (Stroke Patients Only)       Balance Overall balance assessment: No apparent balance deficits (not formally assessed)                                           Pertinent Vitals/Pain Pain Assessment: No/denies pain    Home Living Family/patient expects to be discharged to:: Private residence Living Arrangements: Spouse/significant other Available Help at Discharge: Family (someone around 24/7) Type of Home: House Home Access: Stairs to enter   [employer] of Steps: 1/1 Home Layout: One level Home Equipment:  Environmental consultant - 2 wheels;Bedside commode;Shower seat      Prior Function Level of Independence: Independent               Hand Dominance        Extremity/Trunk Assessment               Lower Extremity Assessment: Overall WFL for tasks assessed         Communication   Communication: No difficulties  Cognition Arousal/Alertness: Awake/alert Behavior During Therapy: WFL for tasks assessed/performed Overall Cognitive Status: Within Functional Limits for tasks assessed                      General Comments General comments (skin integrity, edema, etc.): SpO2 dropped to 79% and EHR of 90's bpm on 3L [place] during gait.  Recovered to 90% in less than a minute and EHR 79 bpm.    Exercises        Assessment/Plan    PT Assessment Patient needs continued PT services  PT Diagnosis Difficulty walking   PT Problem List Decreased activity tolerance;Decreased mobility;Cardiopulmonary status limiting activity  PT Treatment Interventions Gait training;Stair training;Functional mobility training;Therapeutic activities;Patient/family education   PT Goals (Current goals can be found in the Care Plan section) Acute Rehab PT Goals Patient Stated Goal: Get my heart and lungs under control PT Goal Formulation: With patient Time  For Goal Achievement: 03/03/15 Potential to Achieve Goals: Good    Frequency Min 3X/week   Barriers to discharge        Co-evaluation               End of Session Equipment Utilized During Treatment: Oxygen Activity Tolerance: Patient tolerated treatment well;Patient limited by fatigue Patient left: in chair;with call bell/phone within reach;with family/visitor present Nurse Communication: Mobility status         Time: [id number] PT Time Calculation (min) (ACUTE ONLY): 21 min   Charges:   PT Evaluation $Initial PT Evaluation Tier I: 1 Procedure     PT G Codes:        Satonya Lux, Eliseo GumKenneth V 02/24/2015, 4:34 PM 02/24/2015  [place] BingKen  Jaycelyn Orrison, PT [phone]  (pager)

## 2015-02-24 NOTE — Progress Notes (Signed)
Utilization review completed.  

## 2015-02-25 ENCOUNTER — Inpatient Hospital Stay (HOSPITAL_COMMUNITY): Payer: Medicare Other

## 2015-02-25 DIAGNOSIS — R1084 Generalized abdominal pain: Secondary | ICD-10-CM

## 2015-02-25 LAB — COMPREHENSIVE METABOLIC PANEL
ALK PHOS: 61 U/L (ref 39–117)
ALT: 16 U/L (ref 0–35)
AST: 20 U/L (ref 0–37)
Albumin: 3.5 g/dL (ref 3.5–5.2)
Anion gap: 7 (ref 5–15)
BUN: 21 mg/dL (ref 6–23)
CO2: 33 mmol/L — ABNORMAL HIGH (ref 19–32)
Calcium: 9 mg/dL (ref 8.4–10.5)
Chloride: 98 mmol/L (ref 96–112)
Creatinine, Ser: 1.26 mg/dL — ABNORMAL HIGH (ref 0.50–1.10)
GFR, EST AFRICAN AMERICAN: 51 mL/min — AB (ref >90)
GFR, EST NON AFRICAN AMERICAN: 44 mL/min — AB (ref >90)
GLUCOSE: 287 mg/dL — AB (ref 70–99)
Potassium: 4.6 mmol/L (ref 3.5–5.1)
Sodium: 138 mmol/L (ref 135–145)
Total Bilirubin: 0.7 mg/dL (ref 0.3–1.2)
Total Protein: 6.7 g/dL (ref 6.0–8.3)

## 2015-02-25 LAB — CBC
HCT: 34.5 % — ABNORMAL LOW (ref 36.0–46.0)
HEMOGLOBIN: 10 g/dL — AB (ref 12.0–15.0)
MCH: 26 pg (ref 26.0–34.0)
MCHC: 29 g/dL — AB (ref 30.0–36.0)
MCV: 89.6 fL (ref 78.0–100.0)
Platelets: 168 10*3/uL (ref 150–400)
RBC: 3.85 MIL/uL — ABNORMAL LOW (ref 3.87–5.11)
RDW: 15.3 % (ref 11.5–15.5)
WBC: 9 10*3/uL (ref 4.0–10.5)

## 2015-02-25 LAB — RESPIRATORY VIRUS PANEL
Adenovirus: NEGATIVE
INFLUENZA A: NEGATIVE
INFLUENZA B 1: NEGATIVE
METAPNEUMOVIRUS: NEGATIVE
PARAINFLUENZA 1 A: NEGATIVE
PARAINFLUENZA 2 A: NEGATIVE
PARAINFLUENZA 3 A: NEGATIVE
RESPIRATORY SYNCYTIAL VIRUS A: NEGATIVE
Respiratory Syncytial Virus B: NEGATIVE
Rhinovirus: NEGATIVE

## 2015-02-25 LAB — LEGIONELLA ANTIGEN, URINE

## 2015-02-25 LAB — LACTIC ACID, PLASMA: LACTIC ACID, VENOUS: 2.5 mmol/L — AB (ref 0.5–2.0)

## 2015-02-25 MED ORDER — METHYLPREDNISOLONE SODIUM SUCC 40 MG IJ SOLR
40.0000 mg | Freq: Two times a day (BID) | INTRAMUSCULAR | Status: DC
  Administered 2015-02-26: 40 mg via INTRAVENOUS
  Filled 2015-02-25 (×3): qty 1

## 2015-02-25 MED ORDER — FUROSEMIDE 10 MG/ML IJ SOLN
20.0000 mg | Freq: Two times a day (BID) | INTRAMUSCULAR | Status: DC
  Administered 2015-02-25 – 2015-02-26 (×2): 20 mg via INTRAVENOUS
  Filled 2015-02-25 (×3): qty 2

## 2015-02-25 NOTE — Progress Notes (Signed)
Pt refused CPAP for tonight and prefers to wear 2Lpm nasal cannula. Pt states our machine does not provide enough pressure and our mask is uncomfortable. Pt states her husband will bring her home CPAP and mask tomorrow. RT will continue to monitor as needed.

## 2015-02-25 NOTE — Progress Notes (Signed)
CARE MANAGEMENT NOTE 02/25/2015  Patient:  Murlean HarkROBERTSON,Delaney   Account Number:  [id number]  Date Initiated:  02/25/2015  Documentation initiated by:  [facility]Jp [facility]  Subjective/Objective Assessment:   COPD, acute resp failure     Action/Plan:   Anticipated DC Date:     Anticipated DC Plan:  HOME W HOME HEALTH SERVICES      DC Planning Services  CM consult      [facility]   Choice offered to / List presented to:  C-1 Patient           Status of service:  In process, will continue to follow Medicare Important Message given?  YES (If response is "NO", the following Medicare IM given date fields will be blank) Date Medicare IM given:  02/25/2015 Medicare IM given by:  [facility]Terry Abila Date Additional Medicare IM given:   Additional Medicare IM given by:    Discharge Disposition:    Per UR Regulation:    If discussed at Long Length of Stay Meetings, dates discussed:    Comments:  02/25/2015 1400 NCM spoke to pt and [facility]. Instructed pt to bring a portable at time of dc. States she does have port tanks at home. She has aide that comes from [facility] for 2.5 hours per day on Mon-Fri. Provided pt with HHA list for possible HH PT at home. Pt states [facility]. She has RW, 3n1, tub bench and neb machine at home. Isidoro DonningAlesia Takya Vandivier RN CCM Case Mgmt phone [phone]

## 2015-02-25 NOTE — Progress Notes (Signed)
Pt has home CPAP with home mask and tubing. Pt places self on and off CPAP. Wires are intact. RT made pt aware that if she needed any help to call for RT.

## 2015-02-25 NOTE — Progress Notes (Signed)
OT Cancellation Note  Patient Details Name: Murlean HarkCarolyn Mullinix MRN: [id number] DOB: 1951/10/17   Cancelled Treatment:    Reason Eval/Treat Not Completed: Patient declined, no reason specified Pt stated she didn't want to do it right now.  May check back if time permits, if not will re-attempt 3/24.  Kalon Erhardt OTR/L 02/25/2015, 8:49 AM

## 2015-02-25 NOTE — Progress Notes (Addendum)
Patient ID: Analyssa Welch, female   DOB: 08/01/1951, 64 y.o.   MRN: [id number]  [facility] PROGRESS NOTE  Rebecca Welch [id number] DOB: 1951-12-05 DOA: 02/23/2015 PCP: Rebecca Welch   Brief narrative:    64 y.o. female with HTN, GERD, pacemaker placement, CHF, atrial flutter (not on anticoagulation due to history of GI bleeding), history of GI bleeding, history of stroke, asthma, COPD, on home oxygen 2 L, who presented with several days duration of progressive dyspnea with exertion and at rest, associated with wheezing, non productive cough. She explained she was taking care of her grandson who was sick last week. Pt also reported LLQ pain over the past week with some nausea but no vomiting.  She has denied fever, chills, dysuria, urgency, frequency, hematuria, skin rashes. No unilateral weakness, numbness or tingling sensations. No vision change or hearing loss.  In ED, patient was hemodynamically stable, CXR notable for pulmonary vascular congestion, was found to have pulmonary congestion, CT abdomen/pelvis with non-incarcerated hernia, negative for other acute issues.  Assessment/Plan:    Principal Problem:   Acute on chronic hypoxic respiratory failure  - this appears to be be multifactorial and secondary to acute on chronic COPD with bronchitis, acute on chronic diastolic CHF - pt also with morbid obesity and obstructive sleep apnea and OHS  - please note that signs and symptoms have clearly been outlined under HPI above and physical exam below  - pt looks better this AM but still with exertional dyspnea and RR 24 bpm - also continue BD's scheduled and as needed, also continue Levaquin, plan on tapering solumedrol  - continue Lasix but change dose from 40 mg to 20 mg BID IV - provide oxygen via [place]    Acute on chronic diastolic CHF - no previous 2 D ECHO in the records - 2 D ECHO pending  - continue Lasix as noted above but will lower the dose from 40 mg to 20 mg  BID - monitor daily weights, strict I's and O's - weight on admission 160.9kg and now trending down 158 kg this AM   SIRS - criteria met on admission - pt with T 99.1 F, RR 24 bpm, BP 104/52, elevated lactic acid  - Levaquin started for presumptive bronchitis and will continue same regimen for now - UA unremarkable  - follow up on urine and blood cultures    Acute on chronic COPD - treatment with BD's scheduled and as needed - continue solumedrol and plan on tapering down  - empiric ABX as noted above  Active Problems:   Hypertension - BP more stable today  - hold HCTZ and Avapro for now  - continue Lasix for CHF as noted above    Acute renal failure - close monitoring of renal function while pt diuresing  - please note will lower the dose of Lasix  - BMP in AM   Atrial flutter, rate controlled  - continue Flecainide - not AC candidate due to history of GI bleed - monitor on telemetry    Obesity, morbid - Body mass index is 46.81   GERD (gastroesophageal reflux disease) - continue Protonix    Abdominal pain - resolved    Anemia of chronic IDA - no signs of active bleeding, Hg remains stable - CBC in AM   Hyperglycemia, likely steroid induced  - A1C pending   DVT prophylaxis - Heparin SQ  Code Status: Full.  Family Communication:  plan of care discussed with the patient Disposition Plan: Home when  stable.   IV access:  Peripheral IV  Procedures and diagnostic studies:    CXR 02/23/2015   Cardiomegaly with mild pulmonary vascular congestion.     Ct Abdomen Pelvis W Contrast  02/23/2015  No acute abnormality is identified.  There is midline anterior herniation mesenteric fat and colon in the pelvis without evidence of bowel incarceration or obstruction.  Question enlargement of right ovary for patient age. Recommend further evaluation with pelvic ultrasound on outpatient basis.     Medical Consultants:  None   Other Consultants:  None   IAnti-Infectives:    Levaquin 3/22 -->  Faye Ramsay, MD  [facility] Pager [phone]  If 7PM-7AM, please contact night-coverage [email or web address] Password [facility] 02/25/2015, 3:36 PM   LOS: 1 day   HPI/Subjective: No events overnight.   Objective: Filed Vitals:   02/25/15 0503 02/25/15 0506 02/25/15 0742 02/25/15 1420  BP:  129/64  111/59  Pulse:  69  69  Temp:  97.5 F (36.4 C)  98 F (36.7 C)  TempSrc:  Oral  Oral  Resp:  20  24  Height:      Weight: 158.124 kg (348 lb 9.6 oz)     SpO2:  93% 97% 100%    Intake/Output Summary (Last 24 hours) at 02/25/15 1536 Last data filed at 02/25/15 1425  Gross per 24 hour  Intake   1000 ml  Output   1600 ml  Net   -600 ml    Exam:   General:  Pt is alert, follows commands appropriately, not in acute distress  Cardiovascular: Regular rate and rhythm, no rubs, no gallops  Respiratory: Course breath sounds bilaterally with exp wheezing   Abdomen: Soft, non tender, non distended, bowel sounds present, no guarding  Extremities: +1 bilateral LE pitting edema, pulses DP and PT palpable bilaterally  Neuro: Grossly nonfocal  Data Reviewed: Basic Metabolic Panel:  Recent Labs Lab 02/23/15 1850 02/23/15 1914 02/25/15 0644  NA 142  --  138  K 3.8  --  4.6  CL 103  --  98  CO2 32  --  33*  GLUCOSE 164*  --  287*  BUN 17  --  21  CREATININE 1.07  --  1.26*  CALCIUM 8.7  --  9.0  MG  --  2.0  --     Recent Labs Lab 02/24/15 0715  LIPASE 43   CBC:  Recent Labs Lab 02/23/15 1850 02/25/15 0644  WBC 6.3 9.0  NEUTROABS 4.8  --   HGB 10.0* 10.0*  HCT 34.8* 34.5*  MCV 93.3 89.6  PLT 182 168   Cardiac Enzymes:  Recent Labs Lab 02/23/15 1850 02/23/15 2320 02/24/15 0715 02/24/15 1130 02/24/15 1946  TROPONINI 0.03 0.03 0.05* 0.03 0.03   Scheduled Meds: . antiseptic oral rinse  7 mL Mouth Rinse BID  . aspirin EC  81 mg Oral Daily  . dextromethorphan-guaiFENesin  1 tablet Oral BID  . flecainide  50 mg Oral BID  . furosemide  40  mg Intravenous BID  . heparin  5,000 Units Subcutaneous 3 times per day  . ipratropium-albuterol  3 mL Nebulization Q4H  . levofloxacin  500 mg Oral Daily  . methylPREDNISolone (SOLU-MEDROL) injection  60 mg Intravenous Q6H  . pantoprazole  40 mg Oral Daily   Continuous Infusions:

## 2015-02-25 NOTE — Progress Notes (Signed)
OT Cancellation Note  Patient Details Name: Murlean HarkCarolyn Deltoro MRN: [id number] DOB: [birth year]   Cancelled Treatment:    Reason Eval/Treat Not Completed: Patient at procedure or test/ unavailable  Earlie RavelingStraub, Avonelle Viveros L OTR/L [phone] 02/25/2015, 4:45 PM

## 2015-02-25 NOTE — Clinical Documentation Improvement (Addendum)
"  Acute Hypoxic Respiratory Failure" documented in progress note 02/24/15 by Dr. Izola PriceMyers.   Please document the associated Signs and Symptoms to support the diagnosis of Acute Hypoxic Respiratory Failure.   - please note that signs and symptoms have been outlined under HPI and physical exam  Thank you Danie BinderIskra Valen Mascaro   Thank You, Jerral Ralphathy R Stevens ,RN Clinical Documentation Specialist:  [phone] [facility] Health Information Management

## 2015-02-26 LAB — BASIC METABOLIC PANEL
Anion gap: 11 (ref 5–15)
BUN: 27 mg/dL — ABNORMAL HIGH (ref 6–23)
CALCIUM: 8.8 mg/dL (ref 8.4–10.5)
CO2: 29 mmol/L (ref 19–32)
Chloride: 98 mmol/L (ref 96–112)
Creatinine, Ser: 1.04 mg/dL (ref 0.50–1.10)
GFR calc Af Amer: 65 mL/min — ABNORMAL LOW (ref >90)
GFR, EST NON AFRICAN AMERICAN: 56 mL/min — AB (ref >90)
GLUCOSE: 247 mg/dL — AB (ref 70–99)
Potassium: 4.3 mmol/L (ref 3.5–5.1)
Sodium: 138 mmol/L (ref 135–145)

## 2015-02-26 LAB — URINE CULTURE
CULTURE: NO GROWTH
Colony Count: NO GROWTH

## 2015-02-26 LAB — CBC
HCT: 35.7 % — ABNORMAL LOW (ref 36.0–46.0)
Hemoglobin: 10.3 g/dL — ABNORMAL LOW (ref 12.0–15.0)
MCH: 26.5 pg (ref 26.0–34.0)
MCHC: 28.9 g/dL — AB (ref 30.0–36.0)
MCV: 91.8 fL (ref 78.0–100.0)
PLATELETS: 202 10*3/uL (ref 150–400)
RBC: 3.89 MIL/uL (ref 3.87–5.11)
RDW: 15.4 % (ref 11.5–15.5)
WBC: 16 10*3/uL — AB (ref 4.0–10.5)

## 2015-02-26 LAB — HEMOGLOBIN A1C
HEMOGLOBIN A1C: 6.6 % — AB (ref 4.8–5.6)
Mean Plasma Glucose: 143 mg/dL

## 2015-02-26 LAB — LACTIC ACID, PLASMA: Lactic Acid, Venous: 1.7 mmol/L (ref 0.5–2.0)

## 2015-02-26 MED ORDER — FUROSEMIDE 10 MG/ML IJ SOLN
40.0000 mg | Freq: Two times a day (BID) | INTRAMUSCULAR | Status: DC
Start: 2015-02-26 — End: 2015-02-27
  Administered 2015-02-26 – 2015-02-27 (×2): 40 mg via INTRAVENOUS
  Filled 2015-02-26 (×3): qty 4

## 2015-02-26 MED ORDER — METHYLPREDNISOLONE SODIUM SUCC 40 MG IJ SOLR
40.0000 mg | Freq: Three times a day (TID) | INTRAMUSCULAR | Status: DC
  Administered 2015-02-26 – 2015-02-28 (×7): 40 mg via INTRAVENOUS
  Filled 2015-02-26 (×9): qty 1

## 2015-02-26 NOTE — Progress Notes (Addendum)
Inpatient Diabetes Program Recommendations  AACE/ADA: New Consensus Statement on Inpatient Glycemic Control (2013)  Target Ranges:  Prepandial:   less than 140 mg/dL      Peak postprandial:   less than 180 mg/dL (1-2 hours)      Critically ill patients:  140 - 180 mg/dL     Reason for assessment: elevated CBG  Diabetes history: not noted although A1C on 02/26/15 is 6.6% Outpatient Diabetes medications: none Current orders for Inpatient glycemic control: none  Please discuss recent A1C with patient as this result meets the criteria of diabetes based on ADA guidelines. Please consider ordering CBG tid  and starting Novolog moderate correction tid and hs.   Rebecca RacerJulie Wilberth Damon, RN, BA, MHA, CDE Diabetes Coordinator Inpatient Diabetes Program  [phone] (Team Pager) [phone] Rebecca Welch([place] Office) 02/26/2015 10:10 AM

## 2015-02-26 NOTE — Progress Notes (Addendum)
Patient ID: Rebecca Welch, female   DOB: [birth year], 64 y.o.   MRN: [id number]  [facility] PROGRESS NOTE  Rebecca Welch [id number]:[id number] DOB: [birth year] DOA: 02/23/2015 PCP: Rebecca Welch   Brief narrative:    65 y.o. female with HTN, GERD, pacemaker placement, CHF, atrial flutter (not on anticoagulation due to history of GI bleeding), history of GI bleeding, history of stroke, asthma, COPD, on home oxygen 2 L, who presented with several days duration of progressive dyspnea with exertion and at rest, associated with wheezing, non productive cough. She explained she was taking care of her grandson who was sick last week. Pt also reported LLQ pain over the past week with some nausea but no vomiting.  She has denied fever, chills, dysuria, urgency, frequency, hematuria, skin rashes. No unilateral weakness, numbness or tingling sensations. No vision change or hearing loss.  In ED, patient was hemodynamically stable, CXR notable for pulmonary vascular congestion, was found to have pulmonary congestion, CT abdomen/pelvis with non-incarcerated hernia, negative for other acute issues.  Assessment/Plan:    Principal Problem:   Acute on chronic hypoxic respiratory failure  - this appears to be be multifactorial and secondary to acute on chronic COPD with bronchitis, acute on chronic diastolic CHF - pt also with morbid obesity and obstructive sleep apnea and OHS  - pt looks better this AM but still with exertional dyspnea - continue BD's scheduled and as needed, also continue Levaquin, plan on tapering solumedrol  - continue Lasix 40 mg IV BID  - provide oxygen via [place]    Acute on chronic diastolic CHF - no previous 2 D ECHO in the records - 2 D ECHO with normal EF and systolic function but severe pulm HTN  - continue Lasix as noted above  - monitor daily weights, strict I's and O's - weight on admission 160.9kg and now trending down 158 kg this AM   SIRS - criteria met on admission -  pt with T 99.1 F, RR 24 bpm, BP 104/52, elevated lactic acid  - Levaquin started for presumptive bronchitis and will continue same regimen for now - UA unremarkable  - follow up on urine and blood cultures   Acute on chronic COPD - treatment with BD's scheduled and as needed - continue solumedrol with tapering down  - empiric ABX as noted above  Active Problems:   Hypertension - BP more stable today  - hold HCTZ and Avapro  - continue Lasix for CHF as noted above    Acute renal failure - close monitoring of renal function while pt diuresing  - BMP in AM   Atrial flutter, rate controlled  - continue Flecainide - not AC candidate due to history of GI bleed - monitor on telemetry    Obesity, morbid - Body mass index is 46.81   GERD (gastroesophageal reflux disease) - continue Protonix    Abdominal pain - resolved    Anemia of chronic IDA - no signs of active bleeding, Hg remains stable - CBC in AM   Hyperglycemia, likely steroid induced  - pre diabetes  - 6.5 - dietary recommendations provided   DVT prophylaxis - Heparin SQ  Code Status: Full.  Family Communication:  plan of care discussed with the patient Disposition Plan: Home when stable.   IV access:  Peripheral IV  Procedures and diagnostic studies:    CXR 02/23/2015   Cardiomegaly with mild pulmonary vascular congestion.     Ct Abdomen Pelvis W Contrast  02/23/2015  No acute  abnormality is identified.  There is midline anterior herniation mesenteric fat and colon in the pelvis without evidence of bowel incarceration or obstruction.  Question enlargement of right ovary for patient age. Recommend further evaluation with pelvic ultrasound on outpatient basis.     Medical Consultants:  None   Other Consultants:  None   IAnti-Infectives:   Levaquin 3/23 -->  Rebecca Ramsay, MD  [facility] Pager [phone]  If 7PM-7AM, please contact night-coverage [email or web address] Password [facility] 02/26/2015, 6:05 PM   LOS: 2 days    HPI/Subjective: No events overnight.   Objective: Filed Vitals:   02/26/15 0835 02/26/15 1158 02/26/15 1423 02/26/15 1544  BP:   129/60   Pulse:   69   Temp:   97.8 F (36.6 C)   TempSrc:   Oral   Resp:   20   Height:      Weight:      SpO2: 97% 98% 94% 95%    Intake/Output Summary (Last 24 hours) at 02/26/15 1805 Last data filed at 02/26/15 1300  Gross per 24 hour  Intake    720 ml  Output    150 ml  Net    570 ml    Exam:   General:  Pt is alert, follows commands appropriately, not in acute distress  Cardiovascular: Regular rate and rhythm, no rubs, no gallops  Respiratory: Clearer breath sounds with min exp wheezing   Abdomen: Soft, non tender, non distended, bowel sounds present, no guarding  Extremities: +1 bilateral LE pitting edema, pulses DP and PT palpable bilaterally  Neuro: Grossly nonfocal  Data Reviewed: Basic Metabolic Panel:  Recent Labs Lab 02/23/15 1850 02/23/15 1914 02/25/15 0644 02/26/15 0730  NA 142  --  138 138  K 3.8  --  4.6 4.3  CL 103  --  98 98  CO2 32  --  33* 29  GLUCOSE 164*  --  287* 247*  BUN 17  --  21 27*  CREATININE 1.07  --  1.26* 1.04  CALCIUM 8.7  --  9.0 8.8  MG  --  2.0  --   --     Recent Labs Lab 02/24/15 0715  LIPASE 43   CBC:  Recent Labs Lab 02/23/15 1850 02/25/15 0644 02/26/15 0730  WBC 6.3 9.0 16.0*  NEUTROABS 4.8  --   --   HGB 10.0* 10.0* 10.3*  HCT 34.8* 34.5* 35.7*  MCV 93.3 89.6 91.8  PLT 182 168 202   Cardiac Enzymes:  Recent Labs Lab 02/23/15 1850 02/23/15 2320 02/24/15 0715 02/24/15 1130 02/24/15 1946  TROPONINI 0.03 0.03 0.05* 0.03 0.03   Scheduled Meds: . antiseptic oral rinse  7 mL Mouth Rinse BID  . aspirin EC  81 mg Oral Daily  . dextromethorphan-guaiFENesin  1 tablet Oral BID  . flecainide  50 mg Oral BID  . furosemide  40 mg Intravenous BID  . heparin  5,000 Units Subcutaneous 3 times per day  . ipratropium-albuterol  3 mL Nebulization Q4H  . levofloxacin   500 mg Oral Daily  . methylPREDNISolone (SOLU-MEDROL) injection  40 mg Intravenous Q8H  . pantoprazole  40 mg Oral Daily   Continuous Infusions:

## 2015-02-26 NOTE — Progress Notes (Signed)
Medicare Important Message given?  YES (If response is "NO", the following Medicare IM given date fields will be blank) Date Medicare IM given:  02/26/15 Medicare IM given by:  Duana Benedict 

## 2015-02-26 NOTE — Progress Notes (Signed)
Physical Therapy Treatment Patient Details Name: Rebecca Welch MRN: [id number] DOB: 25-Oct-1951 Today's Date: 02/26/2015    History of Present Illness pt is a 64 y/o female admitted with worsening SOB and abdominal pain.    PT Comments    Progressing slowly with cardiopulmonary improvements, but is progressing her mobility on 4L [place]  Follow Up Recommendations  Home health PT     Equipment Recommendations  None recommended by PT    Recommendations for Other Services       Precautions / Restrictions Precautions Precautions: Fall    Mobility  Bed Mobility                  Transfers Overall transfer level: Needs assistance Equipment used: Rolling walker (2 wheeled) Transfers: Sit to/from Stand Sit to Stand: Supervision         General transfer comment: used momentum to get up out of the recliner safely  Ambulation/Gait Ambulation/Gait assistance: Supervision Ambulation Distance (Feet): 120 Feet Assistive device: Rolling walker (2 wheeled) Gait Pattern/deviations: Step-through pattern Gait velocity: slower   General Gait Details: Generally steady and with upright posture.  Appropriate, moderate use of the RW.   Stairs            Wheelchair Mobility    Modified Rankin (Stroke Patients Only)       Balance Overall balance assessment: No apparent balance deficits (not formally assessed)                                  Cognition Arousal/Alertness: Awake/alert Behavior During Therapy: WFL for tasks assessed/performed Overall Cognitive Status: Within Functional Limits for tasks assessed                      Exercises      General Comments General comments (skin integrity, edema, etc.): SpO2 on 4L [place] during gait at 91% and EHR 114 bpm.  Back in room sitting after return from ambulation sats dropped to 86% EHR 100's.  Approx 1 minute to >90% on 4L      Pertinent Vitals/Pain Pain Assessment: No/denies pain    Home  Living                      Prior Function            PT Goals (current goals can now be found in the care plan section) Acute Rehab PT Goals Patient Stated Goal: Get my heart and lungs under control PT Goal Formulation: With patient Time For Goal Achievement: 03/03/15 Potential to Achieve Goals: Good Progress towards PT goals: Progressing toward goals    Frequency  Min 3X/week    PT Plan Current plan remains appropriate    Co-evaluation             End of Session Equipment Utilized During Treatment: Oxygen Activity Tolerance: Patient tolerated treatment well;Patient limited by fatigue Patient left: Other (comment);with call bell/phone within reach;with family/visitor present (sitting EOB)     Time: 1445-1500 PT Time Calculation (min) (ACUTE ONLY): 15 min  Charges:  $Gait Training: 8-22 mins                    G Codes:      Lizmarie Witters, Eliseo GumKenneth V 02/26/2015, 4:36 PM 02/26/2015  Emory BingKen Daiana Vitiello, PT [phone]  (pager)

## 2015-02-26 NOTE — Progress Notes (Signed)
OT Cancellation Note  Patient Details Name: Rebecca HarkCarolyn Welch MRN: [id number] DOB: 1951-03-03   Cancelled Treatment:    Reason Eval/Treat Not Completed: Other (comment). Pt reports she has just returned from bathroom and feels like she needs a breathing treatment. Sats checked and they were 99% on O2, but you could tell pt having difficulty breathing. Called front desk and asked them to have RN call for RT.  Evette GeorgesLeonard, Adaleena Mooers Eva [phone] 02/26/2015, 9:00 AM

## 2015-02-26 NOTE — Progress Notes (Signed)
Message sent to Dr Izola PriceMyers to notify pt having short burst of vtach or SVT this evening about 4:15. Pt sitting on edge of bed in room,states she had been on phone with her daughter and was upset with her. Pt states she felt her heart rate speed up, no other symptoms. Denies any pain. Pt vented a little with nurse about her issues with her oldest daughter, pt stressed over her daughter's poor financial choices and troubles which affect the pt directly. Support offered to pt, pt currently playing solitare card game on her phone trying to relax and keep her mind off her daughter's issues.

## 2015-02-26 NOTE — Care Management Note (Unsigned)
    Page 1 of 1   02/26/2015     6:07:42 PM CARE MANAGEMENT NOTE 02/26/2015  Patient:  Rebecca Welch,Rebecca Welch   Account Number:  [id number]  Date Initiated:  02/25/2015  Documentation initiated by:  [facility]ALESIA  Subjective/Objective Assessment:   COPD, acute resp failure  has home oxygen with [facility], also has an aide.     Action/Plan:   pt eval- rec hhpt   Anticipated DC Date:  02/27/2015   Anticipated DC Plan:  HOME W HOME HEALTH SERVICES      DC Planning Services  CM consult      [facility]   Choice offered to / List presented to:  C-1 Patient           Status of service:  In process, will continue to follow Medicare Important Message given?  YES (If response is "NO", the following Medicare IM given date fields will be blank) Date Medicare IM given:  02/25/2015 Medicare IM given by:  [facility]ALESIA Date Additional Medicare IM given:   Additional Medicare IM given by:    Discharge Disposition:    Per UR Regulation:    If discussed at Long Length of Stay Meetings, dates discussed:    Comments:  02/26/15 1806 [name] RN ,BSN [phone] patient has hh angency list will need to choose agency for hhpt.   02/25/2015 1400 NCM spoke to pt and [facility] Instructed pt to bring a portable at time of dc. States she does have port tanks at home. She has aide that comes from [facility] for 2.5 hours per day on Mon-Fri. Provided pt with HHA list for possible HH PT at home. [facility] She has RW, 3n1, tub bench and neb machine at home. Isidoro DonningAlesia Shavis RN CCM Case Mgmt phone [phone]

## 2015-02-26 NOTE — Progress Notes (Signed)
  Echocardiogram 2D Echocardiogram has been performed.  Rebecca Welch 02/26/2015, 2:04 PM

## 2015-02-27 DIAGNOSIS — I4891 Unspecified atrial fibrillation: Secondary | ICD-10-CM

## 2015-02-27 LAB — BASIC METABOLIC PANEL
ANION GAP: 9 (ref 5–15)
BUN: 38 mg/dL — ABNORMAL HIGH (ref 6–23)
CHLORIDE: 95 mmol/L — AB (ref 96–112)
CO2: 29 mmol/L (ref 19–32)
Calcium: 8.5 mg/dL (ref 8.4–10.5)
Creatinine, Ser: 1.33 mg/dL — ABNORMAL HIGH (ref 0.50–1.10)
GFR calc Af Amer: 48 mL/min — ABNORMAL LOW (ref >90)
GFR calc non Af Amer: 42 mL/min — ABNORMAL LOW (ref >90)
Glucose, Bld: 386 mg/dL — ABNORMAL HIGH (ref 70–99)
Potassium: 4.5 mmol/L (ref 3.5–5.1)
Sodium: 133 mmol/L — ABNORMAL LOW (ref 135–145)

## 2015-02-27 LAB — CBC
HCT: 34.3 % — ABNORMAL LOW (ref 36.0–46.0)
HEMOGLOBIN: 10 g/dL — AB (ref 12.0–15.0)
MCH: 26.3 pg (ref 26.0–34.0)
MCHC: 29.2 g/dL — ABNORMAL LOW (ref 30.0–36.0)
MCV: 90.3 fL (ref 78.0–100.0)
PLATELETS: 202 10*3/uL (ref 150–400)
RBC: 3.8 MIL/uL — AB (ref 3.87–5.11)
RDW: 15.4 % (ref 11.5–15.5)
WBC: 12.7 10*3/uL — ABNORMAL HIGH (ref 4.0–10.5)

## 2015-02-27 LAB — TROPONIN I
TROPONIN I: 0.03 ng/mL (ref <0.031)
Troponin I: 0.07 ng/mL — ABNORMAL HIGH (ref <0.031)
Troponin I: 0.03 ng/mL (ref <0.031)

## 2015-02-27 LAB — LACTIC ACID, PLASMA: LACTIC ACID, VENOUS: 2.6 mmol/L — AB (ref 0.5–2.0)

## 2015-02-27 MED ORDER — FLUCONAZOLE 100 MG PO TABS
100.0000 mg | ORAL_TABLET | Freq: Every day | ORAL | Status: DC
  Administered 2015-02-27 – 2015-03-03 (×5): 100 mg via ORAL
  Filled 2015-02-27 (×6): qty 1

## 2015-02-27 MED ORDER — FUROSEMIDE 10 MG/ML IJ SOLN
80.0000 mg | Freq: Two times a day (BID) | INTRAMUSCULAR | Status: DC
  Administered 2015-02-27 – 2015-02-28 (×2): 80 mg via INTRAVENOUS
  Filled 2015-02-27 (×3): qty 8

## 2015-02-27 MED ORDER — LEVALBUTEROL HCL 1.25 MG/0.5ML IN NEBU
1.2500 mg | INHALATION_SOLUTION | RESPIRATORY_TRACT | Status: DC | PRN
  Administered 2015-02-27 – 2015-02-28 (×2): 1.25 mg via RESPIRATORY_TRACT
  Filled 2015-02-27 (×4): qty 0.5

## 2015-02-27 MED ORDER — FUROSEMIDE 10 MG/ML IJ SOLN: 20.0000 mg | Freq: Two times a day (BID) | INTRAMUSCULAR | Status: DC

## 2015-02-27 MED ORDER — LEVALBUTEROL HCL 1.25 MG/0.5ML IN NEBU
1.2500 mg | INHALATION_SOLUTION | Freq: Three times a day (TID) | RESPIRATORY_TRACT | Status: DC
  Administered 2015-02-27 – 2015-03-01 (×6): 1.25 mg via RESPIRATORY_TRACT
  Filled 2015-02-27 (×12): qty 0.5

## 2015-02-27 MED ORDER — CETYLPYRIDINIUM CHLORIDE 0.05 % MT LIQD
7.0000 mL | Freq: Two times a day (BID) | OROMUCOSAL | Status: DC
  Administered 2015-02-27: 7 mL via OROMUCOSAL

## 2015-02-27 MED ORDER — CHLORHEXIDINE GLUCONATE 0.12 % MT SOLN
15.0000 mL | Freq: Two times a day (BID) | OROMUCOSAL | Status: DC
  Administered 2015-02-27 – 2015-03-04 (×8): 15 mL via OROMUCOSAL
  Filled 2015-02-27 (×13): qty 15

## 2015-02-27 NOTE — Consult Note (Signed)
CARDIOLOGY CONSULT NOTE  Patient ID: Rebecca Welch, MRN: [id number], DOB/AGE: 01-28-1951 64 y.o. Admit date: 02/23/2015 Date of Consult: 02/27/2015  Primary Physician: Bernerd Limbo Primary Cardiologist: Dr Rudolpho Sevin Referring Physician: Dr Izola Price  Chief Complaint: shortness of breath Reason for Consultation: chest pain, heart failure  HPI: This is a 64 year old chronically old woman with oxygen-dependent COPD who presents with progressive shortness of breath. She reports progressive dyspnea over 1-2 weeks with associated edema and orthopnea. She has a chronic cough which is unchanged. The patient is on home oxygen at 2 L/m. She uses BiPAP at night, it has been using this during the day because of worsening dyspnea. She complains of a discomfort in the center of her chest, but this occurs primarily with eating. She denies fevers, chills, or productive cough.  From a cardiac perspective, she has been followed by [facility] in [facility]. She's undergone permanent pacemaker placement. She denies any history of syncope. She has not undergone heart catheterization in the past.  Medical History:  Past Medical History  Diagnosis Date  . Hypertension   . Asthma   . CHF (congestive heart failure)   . Atrial flutter   . Obesity   . Stroke   . GERD (gastroesophageal reflux disease)   . GIB (gastrointestinal bleeding)       Surgical History:  Past Surgical History  Procedure Laterality Date  . Appendectomy    . Bladder repair    . Ovary surgery    . Pacemaker insertion       Home Meds: Prior to Admission medications   Medication Sig Start Date End Date Taking? Authorizing Provider  aspirin 81 MG tablet Take 81 mg by mouth every other day.    Yes Historical Provider, MD  budesonide-formoterol (SYMBICORT) 160-4.5 MCG/ACT inhaler Inhale 1 puff into the lungs 2 (two) times daily.    Yes Historical Provider, MD  ferrous fumarate (HEMOCYTE - 106 MG FE) 325 (106 FE) MG TABS tablet Take 1  tablet by mouth daily.   Yes Historical Provider, MD  flecainide (TAMBOCOR) 50 MG tablet Take 50 mg by mouth 2 (two) times daily.   Yes Historical Provider, MD  furosemide (LASIX) 20 MG tablet Take 80 mg by mouth daily.    Yes Historical Provider, MD  losartan (COZAAR) 50 MG tablet Take 50 mg by mouth daily.   Yes Historical Provider, MD  montelukast (SINGULAIR) 10 MG tablet Take 10 mg by mouth at bedtime.   Yes Historical Provider, MD  pantoprazole (PROTONIX) 40 MG tablet Take 40 mg by mouth 2 (two) times daily.    Yes Historical Provider, MD  tiotropium (SPIRIVA) 18 MCG inhalation capsule Place 18 mcg into inhaler and inhale daily.   Yes Historical Provider, MD  traMADol (ULTRAM) 50 MG tablet Take 50 mg by mouth 2 (two) times daily as needed for moderate pain.   Yes Historical Provider, MD  levofloxacin (LEVAQUIN) 500 MG tablet Take 1 tablet (500 mg total) by mouth daily. 07/27/14   Rolland Porter, MD  methocarbamol (ROBAXIN) 500 MG tablet Take 1 tablet (500 mg total) by mouth 3 (three) times daily between meals as needed. 07/27/14   Rolland Porter, MD    Inpatient Medications:  . antiseptic oral rinse  7 mL Mouth Rinse BID  . aspirin EC  81 mg Oral Daily  . dextromethorphan-guaiFENesin  1 tablet Oral BID  . flecainide  50 mg Oral BID  . furosemide  20 mg Intravenous BID  . heparin  5,000 Units Subcutaneous 3 times per day  . levalbuterol  1.25 mg Nebulization TID PC & HS  . levofloxacin  500 mg Oral Daily  . methylPREDNISolone (SOLU-MEDROL) injection  40 mg Intravenous Q8H  . pantoprazole  40 mg Oral Daily      Allergies: No Known Allergies  History   Social History  . Marital Status: Single    Spouse Name: N/A  . Number of Children: N/A  . Years of Education: N/A   Occupational History  . Not on file.   Social History Main Topics  . Smoking status: Former Games developermoker  . Smokeless tobacco: Not on file  . Alcohol Use: No  . Drug Use: No  . Sexual Activity: Not on file   Other Topics  Concern  . Not on file   Social History Narrative     Family History  Problem Relation Age of Onset  . Diabetes Mother   . Hypertension Mother   . Heart attack Mother   . Heart attack Father   . Diabetes Brother      Review of Systems: General: negative for chills, fever, night sweats or weight changes.  ENT: negative for rhinorrhea or epistaxis Cardiovascular: See history of present illness Dermatological: negative for rash Respiratory: See history of present illness GI: negative for nausea, vomiting, diarrhea, bright red blood per rectum, melena, or hematemesis GU: no hematuria, urgency, or frequency Neurologic: negative for visual changes, syncope, headache, or dizziness Heme: Positive for easy bruising Endo: negative for excessive thirst, thyroid disorder, or flushing Musculoskeletal: negative for joint pain or swelling, negative for myalgias  All other systems reviewed and are otherwise negative except as noted above.  Physical Exam: Blood pressure 130/78, pulse 69, temperature 98.1 F (36.7 C), temperature source Oral, resp. rate 16, height 6\' 1"  (1.854 m), weight 350 lb 8 oz (158.986 kg), SpO2 97 %. Pt is alert and oriented, pleasant morbidly obese woman in no distress on BiPAP HEENT: normal Neck: JVP elevated. Carotid upstrokes normal without bruits. No thyromegaly. Lungs: Diminished breath sounds with diffuse wheezing and rhonchi bilaterally CV: Apex is discrete and nondisplaced, RRR without murmur or gallop Abd: soft, NT, +BS, obese Back: no CVA tenderness Ext: 1+ pretibial edema bilaterally Skin: warm and dry without rash Neuro: CNII-XII intact             Strength intact = bilaterally    Labs:  Recent Labs  02/24/15 1946 02/27/15 1127  TROPONINI 0.03 0.07*   Lab Results  Component Value Date   WBC 12.7* 02/27/2015   HGB 10.0* 02/27/2015   HCT 34.3* 02/27/2015   MCV 90.3 02/27/2015   PLT 202 02/27/2015    Recent Labs Lab 02/25/15 0644   02/27/15 0725  NA 138  < > 133*  K 4.6  < > 4.5  CL 98  < > 95*  CO2 33*  < > 29  BUN 21  < > 38*  CREATININE 1.26*  < > 1.33*  CALCIUM 9.0  < > 8.5  PROT 6.7  --   --   BILITOT 0.7  --   --   ALKPHOS 61  --   --   ALT 16  --   --   AST 20  --   --   GLUCOSE 287*  < > 386*  < > = values in this interval not displayed. No results found for: CHOL, HDL, LDLCALC, TRIG Lab Results  Component Value Date   DDIMER 1.39* 07/27/2014  Radiology/Studies:  Dg Chest 2 View  02/25/2015   CLINICAL DATA:  Shortness of breath, congestion.  EXAM: CHEST  2 VIEW  COMPARISON:  02/23/2015  FINDINGS: Pacer remains in place, unchanged. There is cardiomegaly with vascular congestion. No overt edema. No confluent opacities. Probable small right pleural effusion. No acute bony abnormality.  IMPRESSION: Cardiomegaly with vascular congestion.  Small right effusion.   Electronically Signed   By: Charlett Nose M.D.   On: 02/25/2015 17:31   Dg Chest 2 View  02/23/2015   CLINICAL DATA:  Shortness of breath, history of atrial flutter  EXAM: CHEST  2 VIEW  COMPARISON:  07/27/2014  FINDINGS: There is mild bilateral interstitial thickening. There is no pleural effusion or pneumothorax. There is a dual lead cardiac pacer. There is stable cardiomegaly.  The osseous structures are unremarkable.  IMPRESSION: Cardiomegaly with mild pulmonary vascular congestion.   Electronically Signed   By: Elige Ko   On: 02/23/2015 19:36   Ct Abdomen Pelvis W Contrast  02/23/2015   CLINICAL DATA:  Lower abdomen pain for 2-3 weeks with nausea and diarrhea  EXAM: CT ABDOMEN AND PELVIS WITH CONTRAST  TECHNIQUE: Multidetector CT imaging of the abdomen and pelvis was performed using the standard protocol following bolus administration of intravenous contrast.  CONTRAST:  25mL OMNIPAQUE IOHEXOL 300 MG/ML SOLN, OMNIPAQUE IOHEXOL 300 MG/ML SOLN  COMPARISON:  None.  FINDINGS: There is diffuse fatty infiltration of liver. No focal liver  lesion is identified. The spleen, pancreas, gallbladder, adrenal glands are normal. There are simple cysts in both kidneys, largest in the posterior midpole right kidney measuring 2.6 x 3.7 cm. There is no hydronephrosis bilaterally. There is atherosclerosis of the abdominal aorta without aneurysmal dilatation. There is no abdominal lymphadenopathy. There is venous phleboliths anterior to the mid to distal right ureter.  There is no small bowel obstruction. There is a small hiatal hernia. There is midline anterior herniation of mesenteric fat and colon in the pelvis without evidence of bowel incarceration or obstruction. There is subcutaneous fat stranding of the anterior lower pelvis.  Images of the pelvis demonstrate partial decompressed bladder. There is question enlargement of the right ovary measuring 3.3 x 7.3 cm. There is consolidation of the posterior right lower lobe. Degenerative joint changes of the spine are noted.  IMPRESSION: No acute abnormality is identified.  There is midline anterior herniation mesenteric fat and colon in the pelvis without evidence of bowel incarceration or obstruction.  Question enlargement of right ovary for patient age. Recommend further evaluation with pelvic ultrasound on outpatient basis.   Electronically Signed   By: Sherian Rein M.D.   On: 02/23/2015 22:08    EKG: V-paced with occasional PVC  Cardiac Studies: 2D Echo: Study Conclusions  - Left ventricle: The cavity size was normal. Wall thickness was increased in a pattern of moderate LVH. Systolic function was normal. Wall motion was normal; there were no regional wall motion abnormalities. - Mitral valve: Moderately calcified annulus. There was moderate regurgitation. - Left atrium: The atrium was severely dilated. - Right ventricle: The cavity size was dilated. Systolic function was reduced. - Right atrium: The atrium was moderately dilated. - Tricuspid valve: There was mild-moderate  regurgitation. - Pulmonary arteries: Systolic pressure was severely increased. PA peak pressure: 80 mm Hg (S).  ASSESSMENT AND PLAN:  1. Acute on chronic diastolic/right-sided heart failure with clinical decompensation  2. Status post permanent pacemaker  3. Morbid obesity  4. O2 dependent COPD  5. Atrial  fibrillation, suspect chronic  Discussion: Unfortunately the patient appears to have advanced cardiac disease with severe pulmonary hypertension and significant right heart failure with echocardiographic evidence of right ventricular dilatation and dysfunction. I suspect this is multifactorial with a component related to obesity/hypoventilation, it also she clearly has significant lung disease based on her history and physical exam findings. I think diuresis will be the mainstay of therapy. She has been on 80 mg of furosemide in the morning and 40 mg at noon as an outpatient. Even with her creatinine mildly increased today, I would favor increasing her diuretic as she is still volume overloaded. We will closely follow her renal indices and if continued worsening, can then reduce diuretics. As she's changed back to oral diuretics, she may benefit from the addition of Aldactone in conjunction with Lasix.  With respect to her atrial fibrillation. She has a paced rhythm and I would guess her underlying atrial rhythm is atrial fibrillation. I do not see any P waves or atrial pacing spikes. She doesn't know what type of pacemaker is implanted, but we will investigate and ask for her pacemaker to be interrogated. If her underlying rhythm is atrial fibrillation, I would recommend discontinuation of flecainide as it is probably not providing any benefit.  She cannot tolerate anticoagulation. She's been on warfarin in the past and has had recurrent GI bleeding. She will remain on aspirin alone.  We will follow along with you. I think her overall prognosis is quite tenuous.  Signed, Tonny Bollman  MD, [facility] 02/27/2015, 2:44 PM

## 2015-02-27 NOTE — Progress Notes (Addendum)
Patient ID: Rebecca Welch, female   DOB: November 18, 1951, 64 y.o.   MRN: [id number]  [facility] PROGRESS NOTE  Jacqui Headen DVV:[id number] DOB: [birth year] DOA: 02/23/2015 PCP: Marry Guan   Brief narrative:    64 y.o. female with HTN, GERD, pacemaker placement, CHF, atrial flutter (not on anticoagulation due to history of GI bleeding), history of GI bleeding, history of stroke, asthma, COPD, on home oxygen 2 L, who presented with several days duration of progressive dyspnea with exertion and at rest, associated with wheezing, non productive cough. She explained she was taking care of her grandson who was sick last week. Pt also reported LLQ pain over the past week with some nausea but no vomiting.  She has denied fever, chills, dysuria, urgency, frequency, hematuria, skin rashes. No unilateral weakness, numbness or tingling sensations. No vision change or hearing loss.  In ED, patient was hemodynamically stable, CXR notable for pulmonary vascular congestion, was found to have pulmonary congestion, CT abdomen/pelvis with non-incarcerated hernia, negative for other acute issues.  Assessment/Plan:    Principal Problem:   Acute on chronic hypoxic respiratory failure  - this appears to be be multifactorial and secondary to acute on chronic COPD with bronchitis, acute on chronic diastolic CHF - pt also with morbid obesity and obstructive sleep apnea and OHS  - pt looks better this AM but still with exp wheezing and exertional dyspnea  - continue BD's scheduled and as needed, also continue Levaquin, difficult to taper down solumedrol due to significant wheezing - continue Lasix IV and monitor clinical response  - provide oxygen via [place]    Acute on chronic diastolic CHF - no previous 2 D ECHO in the records - 2 D ECHO with normal EF and systolic function but severe pulm HTN  - continue Lasix as noted above  - monitor daily weights, strict I's and O's - weight on admission 160.9kg and  now trending down 158 kg this AM - cardiology consulted as some PVC's noted on telemetry    Elevated troponin - likely demand ischemia - cardiology consulted for further assistance  - will continue to cycle    SIRS - criteria met on admission - pt with T 99.1 F, RR 24 bpm, BP 104/52, elevated lactic acid  - Levaquin started for presumptive bronchitis and will continue same regimen for now - UA unremarkable  - follow up on urine and blood cultures   Acute on chronic COPD - treatment with BD's scheduled and as needed - continue solumedrol with tapering down as clinically possible, still fairly difficult to persistent wheezing  - empiric ABX as noted above  Active Problems:   Hypertension - BP more stable today  - hold HCTZ and Avapro  - continue Lasix for CHF as noted above    Acute renal failure - close monitoring of renal function while pt diuresing  - follow up on cardiology recommendations on Lasix dosing  - BMP in AM   Atrial fib/flutter, rate controlled  - continue Flecainide - not AC candidate due to history of GI bleed - monitor on telemetry    Obesity, morbid - Body mass index is 46.81   GERD (gastroesophageal reflux disease) - continue Protonix    Abdominal pain - resolved    Anemia of chronic IDA - no signs of active bleeding, Hg remains stable - CBC in AM   Hyperglycemia, likely steroid induced  - pre diabetes  - 6.5 - dietary recommendations provided  - discussed with pt meaning of  A1C, she has verbalized understanding   DVT prophylaxis - Heparin SQ  Code Status: Full.  Family Communication:  plan of care discussed with the patient Disposition Plan: Home when stable.   IV access:  Peripheral IV  Procedures and diagnostic studies:    CXR 02/23/2015   Cardiomegaly with mild pulmonary vascular congestion.     Ct Abdomen Pelvis W Contrast  02/23/2015  No acute abnormality is identified.  There is midline anterior herniation mesenteric fat and colon in the  pelvis without evidence of bowel incarceration or obstruction.  Question enlargement of right ovary for patient age. Recommend further evaluation with pelvic ultrasound on outpatient basis.     Medical Consultants:  Cardiology   Other Consultants:  None   IAnti-Infectives:   Levaquin 3/22 -->  Faye Ramsay, MD  [facility] Pager [phone]  If 7PM-7AM, please contact night-coverage [email or web address] Password [facility] 02/27/2015, 4:56 PM   LOS: 3 days   HPI/Subjective: No events overnight.   Objective: Filed Vitals:   02/27/15 0527 02/27/15 0737 02/27/15 0756 02/27/15 1336  BP: 95/53  138/76 130/78  Pulse: 69  69   Temp: 97.6 F (36.4 C)   98.1 F (36.7 C)  TempSrc: Oral   Oral  Resp: 18   16  Height:      Weight: 158.986 kg (350 lb 8 oz)     SpO2: 99% 91%  97%    Intake/Output Summary (Last 24 hours) at 02/27/15 1656 Last data filed at 02/27/15 1615  Gross per 24 hour  Intake    462 ml  Output   1650 ml  Net  -1188 ml    Exam:   General:  Pt is alert, follows commands appropriately, not in acute distress  Cardiovascular: Irregular rate and rhythm, no rubs, no gallops  Respiratory: Clearer breath sounds but still with scattered rhonchi and exp wheezing   Abdomen: Soft, non tender, non distended, bowel sounds present, no guarding  Extremities: +1 bilateral LE pitting edema, pulses DP and PT palpable bilaterally  Neuro: Grossly nonfocal  Data Reviewed: Basic Metabolic Panel:  Recent Labs Lab 02/23/15 1850 02/23/15 1914 02/25/15 0644 02/26/15 0730 02/27/15 0725  NA 142  --  138 138 133*  K 3.8  --  4.6 4.3 4.5  CL 103  --  98 98 95*  CO2 32  --  33* 29 29  GLUCOSE 164*  --  287* 247* 386*  BUN 17  --  21 27* 38*  CREATININE 1.07  --  1.26* 1.04 1.33*  CALCIUM 8.7  --  9.0 8.8 8.5  MG  --  2.0  --   --   --     Recent Labs Lab 02/24/15 0715  LIPASE 43   CBC:  Recent Labs Lab 02/23/15 1850 02/25/15 0644 02/26/15 0730 02/27/15 0725  WBC  6.3 9.0 16.0* 12.7*  NEUTROABS 4.8  --   --   --   HGB 10.0* 10.0* 10.3* 10.0*  HCT 34.8* 34.5* 35.7* 34.3*  MCV 93.3 89.6 91.8 90.3  PLT 182 168 202 202   Cardiac Enzymes:  Recent Labs Lab 02/23/15 2320 02/24/15 0715 02/24/15 1130 02/24/15 1946 02/27/15 1127  TROPONINI 0.03 0.05* 0.03 0.03 0.07*   Scheduled Meds: . antiseptic oral rinse  7 mL Mouth Rinse BID  . aspirin EC  81 mg Oral Daily  . dextromethorphan-guaiFENesin  1 tablet Oral BID  . flecainide  50 mg Oral BID  . furosemide  80 mg Intravenous BID  .  heparin  5,000 Units Subcutaneous 3 times per day  . levalbuterol  1.25 mg Nebulization TID PC & HS  . levofloxacin  500 mg Oral Daily  . methylPREDNISolone (SOLU-MEDROL) injection  40 mg Intravenous Q8H  . pantoprazole  40 mg Oral Daily   Continuous Infusions:

## 2015-02-27 NOTE — Evaluation (Signed)
Occupational Therapy Evaluation Patient Details Name: Rebecca HarkCarolyn Welch MRN: [id number] DOB: June 22, 1951 Today's Date: 02/27/2015    History of Present Illness pt is a 64 y/o female admitted with worsening SOB and abdominal pain.   Clinical Impression   PTA pt lived at home and reports independence with ADLs. She has a PCA who assists for 2.5 hours daily with IADLs/homecare tasks. Pt required min A for LB ADLs from daughter and was limited by cardiopulmonary status. Pt has no immediate acute OT needs, but [facility] environmental assessment and independence with ADLS.     Follow Up Recommendations  Home health OT;Supervision/Assistance - 24 hour    Equipment Recommendations  None recommended by OT    Recommendations for Other Services       Precautions / Restrictions Precautions Precautions: Fall Restrictions Weight Bearing Restrictions: No      Mobility Bed Mobility Overal bed mobility: Modified Independent                Transfers Overall transfer level: Needs assistance Equipment used: None Transfers: Sit to/from Stand Sit to Stand: Supervision         General transfer comment: Stood from chair and ambulated to bed with Supdervision and no DME.          ADL Overall ADL's : Needs assistance/impaired Eating/Feeding: Independent;Sitting   Grooming: Set up;Sitting   Upper Body Bathing: Set up;Sitting   Lower Body Bathing: Minimal assistance;Sit to/from stand   Upper Body Dressing : Set up;Sitting   Lower Body Dressing: Minimal assistance;Sit to/from stand   Toilet Transfer: Supervision/safety           Functional mobility during ADLs: Supervision/safety General ADL Comments: Pt's daughter assisted with LB ADLs. Pt able to stand from chair and ambulate to bed with Supervision and very comfortable with managing lines and reconnecting O2 tubing.      Vision Vision Assessment?: No apparent visual deficits           Pertinent Vitals/Pain Pain Assessment: No/denies pain        Extremity/Trunk Assessment Upper Extremity Assessment Upper Extremity Assessment: Overall WFL for tasks assessed   Lower Extremity Assessment Lower Extremity Assessment: Overall WFL for tasks assessed   Cervical / Trunk Assessment Cervical / Trunk Assessment: Normal   Communication Communication Communication: No difficulties   Cognition Arousal/Alertness: Awake/alert Behavior During Therapy: WFL for tasks assessed/performed Overall Cognitive Status: Within Functional Limits for tasks assessed                                Home Living Family/patient expects to be discharged to:: Private residence Living Arrangements: Spouse/significant other Available Help at Discharge: Family Type of Home: House Home Access: Stairs to enter Secretary/administratorntrance Stairs-Number of Steps: 1/1   Home Layout: One level     Bathroom Shower/Tub: Tub/shower unit Shower/tub characteristics: Curtain FirefighterBathroom Toilet: Standard     Home Equipment: Environmental consultantWalker - 2 wheels;Bedside commode;Tub bench          Prior Functioning/Environment Level of Independence: Independent        Comments: PCA assists with IADLs/homecare tasks    OT Diagnosis: Generalized weakness;Other (comment) (cardiopulmonary status)    End of Session Equipment Utilized During Treatment: Oxygen  Activity Tolerance: Patient tolerated treatment well Patient left: in bed;with call bell/phone within reach;with family/visitor present   Time: [id number] OT Time Calculation (min): 12 min Charges:  OT General Charges $OT Visit: 1  Procedure OT Evaluation $Initial OT Evaluation Tier I: 1 Procedure G-Codes:    Rae Lips 03/29/15, 3:14 PM   Carney Living, OTR/L Occupational Therapist [phone] (pager)

## 2015-02-27 NOTE — Progress Notes (Signed)
Six beat run of vtach - md notified.

## 2015-02-27 NOTE — Progress Notes (Addendum)
CRITICAL VALUE ALERT  Critical value received: Lactic Acid 2.6  Date of notification:  02/27/15  Time of notification:  0020  Critical value read back: Yes  Nurse who received alert: Romilda GarretAmy Anmol Paschen, RN  MD notified (1st page): Schorr  Time of first page:  0021  MD notified (2nd page): Schorr  Time of second page: [phone]  Responding MD:   Time MD responded:

## 2015-02-27 NOTE — Progress Notes (Signed)
Inpatient Diabetes Program Recommendations  AACE/ADA: New Consensus Statement on Inpatient Glycemic Control (2013)  Target Ranges:  Prepandial:   less than 140 mg/dL      Peak postprandial:   less than 180 mg/dL (1-2 hours)      Critically ill patients:  140 - 180 mg/dL    Results for Rebecca Welch, Rebecca Welch (MRN [id number]) as of 02/27/2015 08:00  Ref. Range 02/25/2015 06:44  Hemoglobin A1C Latest Range: 4.8-5.6 % 6.6 (H)     Reason for assessment: elevated CBG  Diabetes history: not noted although A1C on 02/26/15 is 6.6% Outpatient Diabetes medications: none Current orders for Inpatient glycemic control: none  Patient is on steroids which is likely affecting inpatient blood sugar control.  Please consider ordering CBG tidand starting Novolog moderate correction tid and hs.   Susette RacerJulie Aliahna Statzer, RN, BA, MHA, CDE Diabetes Coordinator Inpatient Diabetes Program  [phone] (Team Pager) [phone] Patrcia Dolly([place] Office) 02/27/2015 8:02 AM

## 2015-02-28 ENCOUNTER — Encounter (HOSPITAL_COMMUNITY): Payer: Self-pay | Admitting: Physician Assistant

## 2015-02-28 DIAGNOSIS — R079 Chest pain, unspecified: Secondary | ICD-10-CM

## 2015-02-28 DIAGNOSIS — G4733 Obstructive sleep apnea (adult) (pediatric): Secondary | ICD-10-CM

## 2015-02-28 DIAGNOSIS — I5023 Acute on chronic systolic (congestive) heart failure: Secondary | ICD-10-CM

## 2015-02-28 DIAGNOSIS — I10 Essential (primary) hypertension: Secondary | ICD-10-CM

## 2015-02-28 DIAGNOSIS — N179 Acute kidney failure, unspecified: Secondary | ICD-10-CM

## 2015-02-28 DIAGNOSIS — R0600 Dyspnea, unspecified: Secondary | ICD-10-CM

## 2015-02-28 DIAGNOSIS — R1310 Dysphagia, unspecified: Secondary | ICD-10-CM

## 2015-02-28 DIAGNOSIS — Z950 Presence of cardiac pacemaker: Secondary | ICD-10-CM

## 2015-02-28 DIAGNOSIS — E669 Obesity, unspecified: Secondary | ICD-10-CM

## 2015-02-28 DIAGNOSIS — I4892 Unspecified atrial flutter: Secondary | ICD-10-CM

## 2015-02-28 DIAGNOSIS — E662 Morbid (severe) obesity with alveolar hypoventilation: Secondary | ICD-10-CM

## 2015-02-28 DIAGNOSIS — I5033 Acute on chronic diastolic (congestive) heart failure: Principal | ICD-10-CM

## 2015-02-28 DIAGNOSIS — I639 Cerebral infarction, unspecified: Secondary | ICD-10-CM

## 2015-02-28 DIAGNOSIS — I482 Chronic atrial fibrillation: Secondary | ICD-10-CM

## 2015-02-28 DIAGNOSIS — K219 Gastro-esophageal reflux disease without esophagitis: Secondary | ICD-10-CM

## 2015-02-28 DIAGNOSIS — J441 Chronic obstructive pulmonary disease with (acute) exacerbation: Secondary | ICD-10-CM

## 2015-02-28 DIAGNOSIS — I248 Other forms of acute ischemic heart disease: Secondary | ICD-10-CM

## 2015-02-28 LAB — BASIC METABOLIC PANEL
Anion gap: 10 (ref 5–15)
BUN: 51 mg/dL — AB (ref 6–23)
CO2: 32 mmol/L (ref 19–32)
CREATININE: 1.38 mg/dL — AB (ref 0.50–1.10)
Calcium: 8.3 mg/dL — ABNORMAL LOW (ref 8.4–10.5)
Chloride: 91 mmol/L — ABNORMAL LOW (ref 96–112)
GFR, EST AFRICAN AMERICAN: 46 mL/min — AB (ref >90)
GFR, EST NON AFRICAN AMERICAN: 40 mL/min — AB (ref >90)
GLUCOSE: 461 mg/dL — AB (ref 70–99)
POTASSIUM: 4.7 mmol/L (ref 3.5–5.1)
Sodium: 133 mmol/L — ABNORMAL LOW (ref 135–145)

## 2015-02-28 LAB — MAGNESIUM: MAGNESIUM: 2.4 mg/dL (ref 1.5–2.5)

## 2015-02-28 MED ORDER — METHYLPREDNISOLONE SODIUM SUCC 40 MG IJ SOLR
40.0000 mg | INTRAMUSCULAR | Status: DC
  Administered 2015-03-01 – 2015-03-02 (×2): 40 mg via INTRAVENOUS
  Filled 2015-02-28 (×2): qty 1

## 2015-02-28 MED ORDER — FUROSEMIDE 10 MG/ML IJ SOLN
60.0000 mg | Freq: Two times a day (BID) | INTRAMUSCULAR | Status: DC
  Administered 2015-02-28 – 2015-03-04 (×8): 60 mg via INTRAVENOUS
  Filled 2015-02-28 (×10): qty 6

## 2015-02-28 NOTE — Progress Notes (Signed)
SUBJECTIVE: Pt says leg swelling has gone down since yesterday. Complains of "burning in chest" when talking while sitting, as well as walking, and relieved with breathing treatments. Also c/o food getting stuck in throat. Has reportedly had EGD's in past, no h/o dilatations per pt.     Intake/Output Summary (Last 24 hours) at 02/28/15 1109 Last data filed at 02/28/15 1027  Gross per 24 hour  Intake    600 ml  Output   3300 ml  Net  -2700 ml    Current Facility-Administered Medications  Medication Dose Route Frequency Provider Last Rate Last Dose  . antiseptic oral rinse (CPC / CETYLPYRIDINIUM CHLORIDE 0.05%) solution 7 mL  7 mL Mouth Rinse BID Dorothea OgleIskra M Myers, MD   7 mL at 02/27/15 0951  . aspirin EC tablet 81 mg  81 mg Oral Daily Lorretta HarpXilin Niu, MD   81 mg at 02/27/15 0951  . chlorhexidine (PERIDEX) 0.12 % solution 15 mL  15 mL Mouth Rinse BID Dorothea OgleIskra M Myers, MD   15 mL at 02/28/15 0936  . dextromethorphan-guaiFENesin (MUCINEX DM) 30-600 MG per 12 hr tablet 1 tablet  1 tablet Oral BID Lorretta HarpXilin Niu, MD   1 tablet at 02/27/15 2119  . flecainide (TAMBOCOR) tablet 50 mg  50 mg Oral BID Lorretta HarpXilin Niu, MD   50 mg at 02/27/15 2118  . fluconazole (DIFLUCAN) tablet 100 mg  100 mg Oral Daily Dorothea OgleIskra M Myers, MD   100 mg at 02/27/15 1729  . furosemide (LASIX) injection 60 mg  60 mg Intravenous BID Laqueta LindenSuresh A Koneswaran, MD      . heparin injection 5,000 Units  5,000 Units Subcutaneous 3 times per day Lorretta HarpXilin Niu, MD   5,000 Units at 02/25/15 1457  . HYDROcodone-acetaminophen (NORCO/VICODIN) 5-325 MG per tablet 1 tablet  1 tablet Oral Q4H PRN Lorretta HarpXilin Niu, MD   1 tablet at 02/27/15 2151  . levalbuterol (XOPENEX) nebulizer solution 1.25 mg  1.25 mg Nebulization Q4H PRN Dorothea OgleIskra M Myers, MD   1.25 mg at 02/28/15 0335  . levalbuterol (XOPENEX) nebulizer solution 1.25 mg  1.25 mg Nebulization TID PC & HS Dorothea OgleIskra M Myers, MD   1.25 mg at 02/28/15 0759  . levofloxacin (LEVAQUIN) tablet 500 mg  500 mg Oral Daily Lorretta HarpXilin Niu,  MD   500 mg at 02/27/15 0951  . methocarbamol (ROBAXIN) tablet 500 mg  500 mg Oral Q8H PRN Lorretta HarpXilin Niu, MD      . methylPREDNISolone sodium succinate (SOLU-MEDROL) 40 mg/mL injection 40 mg  40 mg Intravenous Q8H Dorothea OgleIskra M Myers, MD   40 mg at 02/28/15 0612  . pantoprazole (PROTONIX) EC tablet 40 mg  40 mg Oral Daily Lorretta HarpXilin Niu, MD   40 mg at 02/27/15 0950    Filed Vitals:   02/27/15 2058 02/28/15 0311 02/28/15 0543 02/28/15 0759  BP: 118/51  110/51   Pulse: 69  68   Temp: 97.6 F (36.4 C)  97.6 F (36.4 C)   TempSrc: Oral  Oral   Resp: 18  20   Height:      Weight:   350 lb 5 oz (158.9 kg)   SpO2: 100% 100% 97% 98%    PHYSICAL EXAM General: NAD HEENT: Normal. Neck: No JVD, no thyromegaly.  Lungs: Diminished b/l, no obvious rales. CV: Regular rate and rhythm, normal S1/S2, no S3/S4, no murmur.  1+ pretibial edema.   Abdomen: Obese.  Neurologic: Alert and oriented x 3.  Psych: Normal affect. Musculoskeletal:  No gross deformities. Extremities: No clubbing or cyanosis.   TELEMETRY: Reviewed telemetry pt in paced rhythm, underlying atrial fibrillation, PVC's, artifact, no VT  LABS: Basic Metabolic Panel:  Recent Labs  65/78/46 0725 02/28/15 0541  NA 133* 133*  K 4.5 4.7  CL 95* 91*  CO2 29 32  GLUCOSE 386* 461*  BUN 38* 51*  CREATININE 1.33* 1.38*  CALCIUM 8.5 8.3*   Liver Function Tests: No results for input(s): AST, ALT, ALKPHOS, BILITOT, PROT, ALBUMIN in the last 72 hours. No results for input(s): LIPASE, AMYLASE in the last 72 hours. CBC:  Recent Labs  02/26/15 0730 02/27/15 0725  WBC 16.0* 12.7*  HGB 10.3* 10.0*  HCT 35.7* 34.3*  MCV 91.8 90.3  PLT 202 202   Cardiac Enzymes:  Recent Labs  02/27/15 1127 02/27/15 1628 02/27/15 2159  TROPONINI 0.07* 0.03 0.03   BNP: Invalid input(s): POCBNP D-Dimer: No results for input(s): DDIMER in the last 72 hours. Hemoglobin A1C: No results for input(s): HGBA1C in the last 72 hours. Fasting Lipid Panel: No  results for input(s): CHOL, HDL, LDLCALC, TRIG, CHOLHDL, LDLDIRECT in the last 72 hours. Thyroid Function Tests: No results for input(s): TSH, T4TOTAL, T3FREE, THYROIDAB in the last 72 hours.  Invalid input(s): FREET3 Anemia Panel: No results for input(s): VITAMINB12, FOLATE, FERRITIN, TIBC, IRON, RETICCTPCT in the last 72 hours.  RADIOLOGY: Dg Chest 2 View  02/25/2015   CLINICAL DATA:  Shortness of breath, congestion.  EXAM: CHEST  2 VIEW  COMPARISON:  02/23/2015  FINDINGS: Pacer remains in place, unchanged. There is cardiomegaly with vascular congestion. No overt edema. No confluent opacities. Probable small right pleural effusion. No acute bony abnormality.  IMPRESSION: Cardiomegaly with vascular congestion.  Small right effusion.   Electronically Signed   By: Charlett Nose M.D.   On: 02/25/2015 17:31   Dg Chest 2 View  02/23/2015   CLINICAL DATA:  Shortness of breath, history of atrial flutter  EXAM: CHEST  2 VIEW  COMPARISON:  07/27/2014  FINDINGS: There is mild bilateral interstitial thickening. There is no pleural effusion or pneumothorax. There is a dual lead cardiac pacer. There is stable cardiomegaly.  The osseous structures are unremarkable.  IMPRESSION: Cardiomegaly with mild pulmonary vascular congestion.   Electronically Signed   By: Elige Ko   On: 02/23/2015 19:36   Ct Abdomen Pelvis W Contrast  02/23/2015   CLINICAL DATA:  Lower abdomen pain for 2-3 weeks with nausea and diarrhea  EXAM: CT ABDOMEN AND PELVIS WITH CONTRAST  TECHNIQUE: Multidetector CT imaging of the abdomen and pelvis was performed using the standard protocol following bolus administration of intravenous contrast.  CONTRAST:  25mL OMNIPAQUE IOHEXOL 300 MG/ML SOLN, OMNIPAQUE IOHEXOL 300 MG/ML SOLN  COMPARISON:  None.  FINDINGS: There is diffuse fatty infiltration of liver. No focal liver lesion is identified. The spleen, pancreas, gallbladder, adrenal glands are normal. There are simple cysts in both kidneys,  largest in the posterior midpole right kidney measuring 2.6 x 3.7 cm. There is no hydronephrosis bilaterally. There is atherosclerosis of the abdominal aorta without aneurysmal dilatation. There is no abdominal lymphadenopathy. There is venous phleboliths anterior to the mid to distal right ureter.  There is no small bowel obstruction. There is a small hiatal hernia. There is midline anterior herniation of mesenteric fat and colon in the pelvis without evidence of bowel incarceration or obstruction. There is subcutaneous fat stranding of the anterior lower pelvis.  Images of the pelvis demonstrate partial decompressed bladder. There  is question enlargement of the right ovary measuring 3.3 x 7.3 cm. There is consolidation of the posterior right lower lobe. Degenerative joint changes of the spine are noted.  IMPRESSION: No acute abnormality is identified.  There is midline anterior herniation mesenteric fat and colon in the pelvis without evidence of bowel incarceration or obstruction.  Question enlargement of right ovary for patient age. Recommend further evaluation with pelvic ultrasound on outpatient basis.   Electronically Signed   By: Sherian Rein M.D.   On: 02/23/2015 22:08      ASSESSMENT AND PLAN: 1. 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 I suspect this is multifactorial with a component related to obesity/hypoventilation syndrome, and she also clearly has significant lung disease based on her history and physical exam findings. I think diuresis will be the mainstay of therapy.  Has diuresed nearly 2.5 L on Lasix 80 mg IV bid. However, BUN and creatinine increasing. Will decrease dose to 60 mg IV bid. As she's changed back to oral diuretics,  she may benefit from the addition of Aldactone in conjunction with Lasix.  2. Status post permanent pacemaker: Telemetry shows underlying atrial fibrillation. PVC's noted. Will check serum magnesium (normal at 2 on 3/21). K normal at 4.7. Will have pacemaker interrogated today.  3. Morbid obesity  4. 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  5. Atrial fibrillation, suspect chronic: No anticoagulation due to h/o GI bleeding on warfarin in past. Continue ASA 81 mg. No obvious benefit from flecainide so will discontinue. Pacemaker interrogation today.   6. Troponin elevation: Now normalized, and likely related to demand ischemia. Chest discomfort related to bronchitis and esophageal dysphagia rather than CAD.  7. Dysphagia for solids: Would recommend outpatient EGD has she may have developed a stricture.    Prentice Docker, M.D., F.A.C.C.

## 2015-02-28 NOTE — Progress Notes (Signed)
Patient ID: Rebecca Welch, female   DOB: Apr 03, 1951, 64 y.o.   MRN: [id number]  [facility] PROGRESS NOTE  Rebecca Welch BWI:[id number] DOB: September 23, 1951 DOA: 02/23/2015 PCP: Marry Guan   Brief narrative:    64 y.o. female with HTN, GERD, pacemaker placement, CHF, atrial flutter (not on anticoagulation due to history of GI bleeding), history of GI bleeding, history of stroke, asthma, COPD, on home oxygen 2 L, who presented with several days duration of progressive dyspnea with exertion and at rest, associated with wheezing, non productive cough. She also reported LLQ pain over the past week with some nausea but no vomiting.  In ED, patient was hemodynamically stable, CXR notable for pulmonary vascular congestion. CT abdomen/pelvis with non-incarcerated hernia, negative for other acute issues.  Hospital course is complicated with acute decompensated CHF with troponin elevation. Cardiology has seen the pt in consultation.   Assessment/Plan:    Principal Problem:   Acute on chronic hypoxic respiratory failure / Acute COPD exacerbation with acute bronchitis  - Likely multifactorial and secondary to acute on chronic COPD with bronchitis, oxygen dependent, acute on chronic diastolic CHF, OSA/OHS - Respiratory status stable this am. - Continue Xopenex scheduled and as needed - Continue steroids (solemedrol but taper down to once a day regimen  - Oxygen support via [place] to keep O2 sat above 90%    Acute on chronic diastolic right sided CHF - 2 D ECHO with normal EF and systolic function but severe pulm HTN. Decompensated CHF with right side heart failure also likely due ot OHS. - Appreciate cardiology following - Continue lasix 60 mg IV BID. She was on 80 mg IV BID and since BUN/Cr increasing dose changed to 60 mg - Weight same in past 24 hours: 158.9 kg - Continue daily weight and strict intake and output - Replete electrolytes as needed      Elevated troponin - Likely demand  ischemia from combination of COPD and CHF - Troponin level normalized       SIRS - Criteria met on admission - Pt with T 99.1 F, RR 24 bpm, BP 104/52, elevated lactic acid  - Levaquin started for presumptive bronchitis    Active Problems:   Hypertension - Continue lasix 60 mg IV BID    Acute renal failure - Secondary to Lasix - Continue to monitor renal function while she is on IV lasix    Atrial fib/flutter, rate controlled  - CHADS vasc score 3 - not AC candidate due to history of GI bleed - No effect of flecainide noted so per cardio stopped, now on aspirin only  - monitor on telemetry     Status post permanent pacemaker - Telemetry showed underlying atrial fibrillation. PVC's noted. Cardio to interrogate pacemaker    Obesity, morbid - Body mass index is 46.81 - Counseled on nutrition    GERD (gastroesophageal reflux disease) - continue Protonix     Abdominal pain - Resolved     Anemia of chronic IDA - No reports of bleeding - Hemoglobin stable     Hyperglycemia, likely steroid induced  - pre diabetes  - 6.5 - dietary recommendations provided   DVT prophylaxis - Heparin SQ  Code Status: Full.  Family Communication:  plan of care discussed with the patient Disposition Plan: not yet stable for discharge. Getting lasix IV.   IV access:  Peripheral IV  Procedures and diagnostic studies:    CXR 02/23/2015   Cardiomegaly with mild pulmonary vascular congestion.     Ct  Abdomen Pelvis W Contrast  02/23/2015  No acute abnormality is identified.  There is midline anterior herniation mesenteric fat and colon in the pelvis without evidence of bowel incarceration or obstruction.  Question enlargement of right ovary for patient age. Recommend further evaluation with pelvic ultrasound on outpatient basis.     Medical Consultants:  Cardiology   Other Consultants:  None   IAnti-Infectives:   Levaquin 3/22 --> Fluconazole 3/22 -->  Leisa Lenz, MD  [facility] Pager  [phone]  If 7PM-7AM, please contact night-coverage [email or web address] Password [facility] 02/28/2015, 6:21 PM   LOS: 4 days   HPI/Subjective: No events overnight.   Objective: Filed Vitals:   02/28/15 0543 02/28/15 0759 02/28/15 1338 02/28/15 1402  BP: 110/51   139/81  Pulse: 68   70  Temp: 97.6 F (36.4 C)   97.8 F (36.6 C)  TempSrc: Oral   Oral  Resp: 20   18  Height:      Weight: 158.9 kg (350 lb 5 oz)     SpO2: 97% 98% 98% 95%    Intake/Output Summary (Last 24 hours) at 02/28/15 1821 Last data filed at 02/28/15 1805  Gross per 24 hour  Intake    822 ml  Output   2100 ml  Net  -1278 ml    Exam:   General:  Pt is not in distress  Cardiovascular: Irregular rate and rhythm, appreciate S1,S2  Respiratory: diminished breath sounds, mild wheezing in upper lung lobes   Abdomen: Soft, non tender, non distended, appreciate BS  Extremities:LE pitting edema +1, no cyanosis, pulses palpable   Neuro: non focal   Data Reviewed: Basic Metabolic Panel:  Recent Labs Lab 02/23/15 1850 02/23/15 1914 02/25/15 0644 02/26/15 0730 02/27/15 0725 02/28/15 0541 02/28/15 1200  NA 142  --  138 138 133* 133*  --   K 3.8  --  4.6 4.3 4.5 4.7  --   CL 103  --  98 98 95* 91*  --   CO2 32  --  33* 29 29 32  --   GLUCOSE 164*  --  287* 247* 386* 461*  --   BUN 17  --  21 27* 38* 51*  --   CREATININE 1.07  --  1.26* 1.04 1.33* 1.38*  --   CALCIUM 8.7  --  9.0 8.8 8.5 8.3*  --   MG  --  2.0  --   --   --   --  2.4    Recent Labs Lab 02/24/15 0715  LIPASE 43   CBC:  Recent Labs Lab 02/23/15 1850 02/25/15 0644 02/26/15 0730 02/27/15 0725  WBC 6.3 9.0 16.0* 12.7*  NEUTROABS 4.8  --   --   --   HGB 10.0* 10.0* 10.3* 10.0*  HCT 34.8* 34.5* 35.7* 34.3*  MCV 93.3 89.6 91.8 90.3  PLT 182 168 202 202   Cardiac Enzymes:  Recent Labs Lab 02/24/15 1130 02/24/15 1946 02/27/15 1127 02/27/15 1628 02/27/15 2159  TROPONINI 0.03 0.03 0.07* 0.03 0.03   Scheduled Meds: .  aspirin EC  81 mg Oral Daily  . dextromethorphan-guaiFE  1 tablet Oral BID  . fluconazole  100 mg Oral Daily  . furosemide  60 mg Intravenous BID  . heparin  5,000 Units Subcutaneous 3 times per day  . levalbuterol  1.25 mg Nebulization TID PC & HS  . levofloxacin  500 mg Oral Daily  . methylPREDNISolone  40 mg Intravenous Q8H  . pantoprazole  40 mg Oral  Daily

## 2015-03-01 DIAGNOSIS — J45901 Unspecified asthma with (acute) exacerbation: Secondary | ICD-10-CM

## 2015-03-01 DIAGNOSIS — R109 Unspecified abdominal pain: Secondary | ICD-10-CM

## 2015-03-01 LAB — BASIC METABOLIC PANEL
Anion gap: 13 (ref 5–15)
BUN: 51 mg/dL — ABNORMAL HIGH (ref 6–23)
CALCIUM: 8.6 mg/dL (ref 8.4–10.5)
CO2: 31 mmol/L (ref 19–32)
CREATININE: 1.34 mg/dL — AB (ref 0.50–1.10)
Chloride: 91 mmol/L — ABNORMAL LOW (ref 96–112)
GFR, EST AFRICAN AMERICAN: 48 mL/min — AB (ref >90)
GFR, EST NON AFRICAN AMERICAN: 41 mL/min — AB (ref >90)
GLUCOSE: 462 mg/dL — AB (ref 70–99)
Potassium: 4.9 mmol/L (ref 3.5–5.1)
Sodium: 135 mmol/L (ref 135–145)

## 2015-03-01 MED ORDER — LEVALBUTEROL HCL 1.25 MG/0.5ML IN NEBU
1.2500 mg | INHALATION_SOLUTION | Freq: Three times a day (TID) | RESPIRATORY_TRACT | Status: DC
  Administered 2015-03-01 – 2015-03-04 (×7): 1.25 mg via RESPIRATORY_TRACT
  Filled 2015-03-01 (×11): qty 0.5

## 2015-03-01 NOTE — Progress Notes (Signed)
Patient ID: Rebecca Welch, female   DOB: December 31, 1950, 64 y.o.   MRN: [id number]  [facility] PROGRESS NOTE  Rebecca Welch OAC:[id number] DOB: 1951/06/18 DOA: 02/23/2015 PCP: Marry Guan   Brief narrative:    64 y.o. female with HTN, GERD, pacemaker placement, CHF, atrial flutter (not on anticoagulation due to history of GI bleeding), history of GI bleeding, history of stroke, asthma, COPD, on home oxygen 2 L, who presented with several days duration of progressive dyspnea with exertion and at rest, associated with wheezing, non productive cough. She also reported LLQ pain over the past week with some nausea but no vomiting.  In ED, patient was hemodynamically stable, CXR notable for pulmonary vascular congestion. CT abdomen/pelvis with non-incarcerated hernia, negative for other acute issues.  Hospital course is complicated with acute decompensated CHF with troponin elevation. Cardiology has seen the pt in consultation.   Assessment/Plan:    Principal Problem:   Acute on chronic hypoxic respiratory failure / Acute COPD exacerbation with acute bronchitis  - Likely multifactorial and secondary to acute on chronic COPD with bronchitis, oxygen dependent, acute on chronic diastolic CHF, OSA/OHS - Respiratory status stable this am. - Continue Xopenex scheduled and as needed - continue to taper steroids down  - Oxygen support via [place] to keep O2 sat above 90%    Acute on chronic diastolic right sided CHF - 2 D ECHO with normal EF and systolic function but severe pulm HTN. Decompensated CHF with right side heart failure also likely due ot OHS. - Appreciate cardiology following - Continue lasix 60 mg IV BID. She was on 80 mg IV BID and since BUN/Cr increasing dose changed to 60 mg - weight still trending up from 438 --> 350 --> 354 lbs this AM - Continue daily weight and strict intake and output - Replete electrolytes as needed      Elevated troponin - Likely demand ischemia from  combination of COPD and CHF - Troponin level normalized  - appreciate cardiology assistance      SIRS - Criteria met on admission - Pt with T 99.1 F, RR 24 bpm, BP 104/52, elevated lactic acid  - Levaquin started for presumptive bronchitis    Active Problems:   Hypertension - Continue lasix 60 mg IV BID    Acute renal failure - Secondary to Lasix - Continue to monitor renal function while she is on IV lasix    Atrial fib/flutter, rate controlled  - CHADS vasc score 3 - not AC candidate due to history of GI bleed - No effect of flecainide noted so per cardio stopped, now on aspirin only  - monitor on telemetry     Status post permanent pacemaker - Telemetry showed underlying atrial fibrillation. PVC's noted - Cardio interrogated pacemaker 3/26    Obesity, morbid - Body mass index is 46.81 - Counseled on nutrition    GERD (gastroesophageal reflux disease) - continue Protonix     Abdominal pain - Resolved     Anemia of chronic IDA - No reports of bleeding - Hemoglobin stable     Hyperglycemia, likely steroid induced  - pre diabetes  - 6.5 - dietary recommendations provided   DVT prophylaxis - Heparin SQ  Code Status: Full.  Family Communication:  plan of care discussed with the patient Disposition Plan: not yet stable for discharge. Getting lasix IV.   IV access:  Peripheral IV  Procedures and diagnostic studies:    CXR 02/23/2015   Cardiomegaly with mild pulmonary vascular congestion.  Ct Abdomen Pelvis W Contrast  02/23/2015  No acute abnormality is identified.  There is midline anterior herniation mesenteric fat and colon in the pelvis without evidence of bowel incarceration or obstruction.  Question enlargement of right ovary for patient age. Recommend further evaluation with pelvic ultrasound on outpatient basis.     Medical Consultants:  Cardiology   Other Consultants:  None   IAnti-Infectives:   Levaquin 3/22 --> Fluconazole 3/22  -->  Faye Ramsay, MD  [facility] Pager [phone]  If 7PM-7AM, please contact night-coverage [email or web address] Password [email or web address] 03/01/2015, 4:20 PM   LOS: 5 days   HPI/Subjective: No events overnight.   Objective: Filed Vitals:   02/28/15 2159 03/01/15 0458 03/01/15 0500 03/01/15 0700  BP: 127/66 136/80    Pulse: 74 68    Temp: 97.7 F (36.5 C) 97.7 F (36.5 C)    TempSrc: Oral Oral    Resp: 20 16    Height:      Weight:   160.9 kg (354 lb 11.5 oz)   SpO2: 92% 98%  97%    Intake/Output Summary (Last 24 hours) at 03/01/15 1620 Last data filed at 03/01/15 1355  Gross per 24 hour  Intake    700 ml  Output   2600 ml  Net  -1900 ml    Exam:   General:  Pt is not in distress  Cardiovascular: Irregular rate and rhythm, appreciate S1,S2  Respiratory: diminished breath sounds, mild wheezing in upper lung lobes   Abdomen: Soft, non tender, non distended, appreciate BS  Extremities: LE pitting edema +1, no cyanosis, pulses palpable   Neuro: non focal   Data Reviewed: Basic Metabolic Panel:  Recent Labs Lab 02/23/15 1914 02/25/15 0644 02/26/15 0730 02/27/15 0725 02/28/15 0541 02/28/15 1200 03/01/15 0920  NA  --  138 138 133* 133*  --  135  K  --  4.6 4.3 4.5 4.7  --  4.9  CL  --  98 98 95* 91*  --  91*  CO2  --  33* 29 29 32  --  31  GLUCOSE  --  287* 247* 386* 461*  --  462*  BUN  --  21 27* 38* 51*  --  51*  CREATININE  --  1.26* 1.04 1.33* 1.38*  --  1.34*  CALCIUM  --  9.0 8.8 8.5 8.3*  --  8.6  MG 2.0  --   --   --   --  2.4  --     Recent Labs Lab 02/24/15 0715  LIPASE 43   CBC:  Recent Labs Lab 02/23/15 1850 02/25/15 0644 02/26/15 0730 02/27/15 0725  WBC 6.3 9.0 16.0* 12.7*  NEUTROABS 4.8  --   --   --   HGB 10.0* 10.0* 10.3* 10.0*  HCT 34.8* 34.5* 35.7* 34.3*  MCV 93.3 89.6 91.8 90.3  PLT 182 168 202 202   Cardiac Enzymes:  Recent Labs Lab 02/24/15 1130 02/24/15 1946 02/27/15 1127 02/27/15 1628 02/27/15 2159  TROPONINI 0.03  0.03 0.07* 0.03 0.03   Scheduled Meds: . aspirin EC  81 mg Oral Daily  . dextromethorphan  1 tablet Oral BID  . fluconazole  100 mg Oral Daily  . furosemide  60 mg Intravenous BID  . heparin  5,000 Units Subcutaneous 3 times per day  . levalbuterol  1.25 mg Nebulization TID PC & HS  . levofloxacin  500 mg Oral Daily  . methylPREDNISolone in  40 mg Intravenous Q24H  . pantoprazole  40 mg Oral Daily

## 2015-03-01 NOTE — Progress Notes (Signed)
Patient places herself on CPAP.

## 2015-03-01 NOTE — Progress Notes (Signed)
SUBJECTIVE: Grateful for care she has received. Says she still gets dyspneic with exertion. Glad flecainide was stopped. Says leg swelling has gone down since yesterday.     Intake/Output Summary (Last 24 hours) at 03/01/15 1610 Last data filed at 03/01/15 0505  Gross per 24 hour  Intake    700 ml  Output   2400 ml  Net  -1700 ml    Current Facility-Administered Medications  Medication Dose Route Frequency Provider Last Rate Last Dose  . antiseptic oral rinse (CPC / CETYLPYRIDINIUM CHLORIDE 0.05%) solution 7 mL  7 mL Mouth Rinse BID Dorothea Ogle, MD   7 mL at 02/28/15 1112  . aspirin EC tablet 81 mg  81 mg Oral Daily Lorretta Harp, MD   81 mg at 02/28/15 1111  . chlorhexidine (PERIDEX) 0.12 % solution 15 mL  15 mL Mouth Rinse BID Dorothea Ogle, MD   15 mL at 03/01/15 0805  . dextromethorphan-guaiFENesin (MUCINEX DM) 30-600 MG per 12 hr tablet 1 tablet  1 tablet Oral BID Lorretta Harp, MD   1 tablet at 02/28/15 2216  . fluconazole (DIFLUCAN) tablet 100 mg  100 mg Oral Daily Dorothea Ogle, MD   100 mg at 02/28/15 1112  . furosemide (LASIX) injection 60 mg  60 mg Intravenous BID Laqueta Linden, MD   60 mg at 03/01/15 0803  . heparin injection 5,000 Units  5,000 Units Subcutaneous 3 times per day Lorretta Harp, MD   5,000 Units at 02/25/15 1457  . HYDROcodone-acetaminophen (NORCO/VICODIN) 5-325 MG per tablet 1 tablet  1 tablet Oral Q4H PRN Lorretta Harp, MD   1 tablet at 02/28/15 1945  . levalbuterol (XOPENEX) nebulizer solution 1.25 mg  1.25 mg Nebulization Q4H PRN Dorothea Ogle, MD   1.25 mg at 02/28/15 0335  . levalbuterol (XOPENEX) nebulizer solution 1.25 mg  1.25 mg Nebulization TID PC & HS Dorothea Ogle, MD   1.25 mg at 03/01/15 0827  . levofloxacin (LEVAQUIN) tablet 500 mg  500 mg Oral Daily Lorretta Harp, MD   500 mg at 02/28/15 1110  . methocarbamol (ROBAXIN) tablet 500 mg  500 mg Oral Q8H PRN Lorretta Harp, MD      . methylPREDNISolone sodium succinate (SOLU-MEDROL) 40 mg/mL injection 40  mg  40 mg Intravenous Q24H Alison Murray, MD      . pantoprazole (PROTONIX) EC tablet 40 mg  40 mg Oral Daily Lorretta Harp, MD   40 mg at 02/28/15 1110    Filed Vitals:   02/28/15 2159 03/01/15 0458 03/01/15 0500 03/01/15 0700  BP: 127/66 136/80    Pulse: 74 68    Temp: 97.7 F (36.5 C) 97.7 F (36.5 C)    TempSrc: Oral Oral    Resp: 20 16    Height:      Weight:   354 lb 11.5 oz (160.9 kg)   SpO2: 92% 98%  97%    PHYSICAL EXAM General: NAD, using oxygen by nasal cannula. HEENT: Normal. Neck: No JVD, no thyromegaly.  Lungs: Diminished b/l, no obvious rales. CV: Regular rate and rhythm, normal S1/S2, no S3/S4, no murmur. 1+ pretibial edema.  Abdomen: Obese.  Neurologic: Alert and oriented x 3.  Psych: Normal affect. Musculoskeletal: No gross deformities. Extremities: No clubbing or cyanosis.   TELEMETRY: Reviewed telemetry pt in AV paced and V paced rhythm with PVC's.  LABS: Basic Metabolic Panel:  Recent Labs  96/04/54 0725 02/28/15 0541 02/28/15 1200  NA 133* 133*  --   K 4.5 4.7  --   CL 95* 91*  --   CO2 29 32  --   GLUCOSE 386* 461*  --   BUN 38* 51*  --   CREATININE 1.33* 1.38*  --   CALCIUM 8.5 8.3*  --   MG  --   --  2.4   Liver Function Tests: No results for input(s): AST, ALT, ALKPHOS, BILITOT, PROT, ALBUMIN in the last 72 hours. No results for input(s): LIPASE, AMYLASE in the last 72 hours. CBC:  Recent Labs  02/27/15 0725  WBC 12.7*  HGB 10.0*  HCT 34.3*  MCV 90.3  PLT 202   Cardiac Enzymes:  Recent Labs  02/27/15 1127 02/27/15 1628 02/27/15 2159  TROPONINI 0.07* 0.03 0.03   BNP: Invalid input(s): POCBNP D-Dimer: No results for input(s): DDIMER in the last 72 hours. Hemoglobin A1C: No results for input(s): HGBA1C in the last 72 hours. Fasting Lipid Panel: No results for input(s): CHOL, HDL, LDLCALC, TRIG, CHOLHDL, LDLDIRECT in the last 72 hours. Thyroid Function Tests: No results for input(s): TSH, T4TOTAL, T3FREE,  THYROIDAB in the last 72 hours.  Invalid input(s): FREET3 Anemia Panel: No results for input(s): VITAMINB12, FOLATE, FERRITIN, TIBC, IRON, RETICCTPCT in the last 72 hours.  RADIOLOGY: Dg Chest 2 View  02/25/2015   CLINICAL DATA:  Shortness of breath, congestion.  EXAM: CHEST  2 VIEW  COMPARISON:  02/23/2015  FINDINGS: Pacer remains in place, unchanged. There is cardiomegaly with vascular congestion. No overt edema. No confluent opacities. Probable small right pleural effusion. No acute bony abnormality.  IMPRESSION: Cardiomegaly with vascular congestion.  Small right effusion.   Electronically Signed   By: Charlett Nose M.D.   On: 02/25/2015 17:31   Dg Chest 2 View  02/23/2015   CLINICAL DATA:  Shortness of breath, history of atrial flutter  EXAM: CHEST  2 VIEW  COMPARISON:  07/27/2014  FINDINGS: There is mild bilateral interstitial thickening. There is no pleural effusion or pneumothorax. There is a dual lead cardiac pacer. There is stable cardiomegaly.  The osseous structures are unremarkable.  IMPRESSION: Cardiomegaly with mild pulmonary vascular congestion.   Electronically Signed   By: Elige Ko   On: 02/23/2015 19:36   Ct Abdomen Pelvis W Contrast  02/23/2015   CLINICAL DATA:  Lower abdomen pain for 2-3 weeks with nausea and diarrhea  EXAM: CT ABDOMEN AND PELVIS WITH CONTRAST  TECHNIQUE: Multidetector CT imaging of the abdomen and pelvis was performed using the standard protocol following bolus administration of intravenous contrast.  CONTRAST:  25mL OMNIPAQUE IOHEXOL 300 MG/ML SOLN, OMNIPAQUE IOHEXOL 300 MG/ML SOLN  COMPARISON:  None.  FINDINGS: There is diffuse fatty infiltration of liver. No focal liver lesion is identified. The spleen, pancreas, gallbladder, adrenal glands are normal. There are simple cysts in both kidneys, largest in the posterior midpole right kidney measuring 2.6 x 3.7 cm. There is no hydronephrosis bilaterally. There is atherosclerosis of the abdominal aorta  without aneurysmal dilatation. There is no abdominal lymphadenopathy. There is venous phleboliths anterior to the mid to distal right ureter.  There is no small bowel obstruction. There is a small hiatal hernia. There is midline anterior herniation of mesenteric fat and colon in the pelvis without evidence of bowel incarceration or obstruction. There is subcutaneous fat stranding of the anterior lower pelvis.  Images of the pelvis demonstrate partial decompressed bladder. There is question enlargement of the right ovary measuring 3.3 x 7.3 cm.  There is consolidation of the posterior right lower lobe. Degenerative joint changes of the spine are noted.  IMPRESSION: No acute abnormality is identified.  There is midline anterior herniation mesenteric fat and colon in the pelvis without evidence of bowel incarceration or obstruction.  Question enlargement of right ovary for patient age. Recommend further evaluation with pelvic ultrasound on outpatient basis.   Electronically Signed   By: Sherian ReinWei-Chen  Lin M.D.   On: 02/23/2015 22:08      ASSESSMENT AND PLAN: 1. 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 I suspect this is multifactorial with a component related to obesity/hypoventilation syndrome, and she also clearly has significant lung disease based on her history and physical exam findings. I think diuresis will be the mainstay of therapy.  Has diuresed 1. 7 L on Lasix 60 mg IV bid in last 24 hours. Will check BMET today to assess renal function. As she's changed back to oral diuretics, she may benefit from the addition of Aldactone in conjunction with Lasix if renal function allows.  2. Status post permanent pacemaker: Telemetry shows underlying atrial  fibrillation. Magnesium checked on 3/26 and was normal. Pacemaker interrogation on 3/26 showed atrial fibrillation ongoing since 07/2014 and V-pacing >90%. Flecainide d/c on 3/26.  3. Morbid obesity  4. 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  5. Atrial fibrillation, suspect chronic: No anticoagulation due to h/o GI bleeding on warfarin in past. Continue ASA 81 mg. No obvious benefit from flecainide so discontinued on 3/26. Pacemaker interrogation completed on 3/26.   6. Troponin elevation: Now normalized, and likely related to demand ischemia. Chest discomfort related to bronchitis and esophageal dysphagia rather than CAD.  7. Dysphagia for solids: Would recommend outpatient EGD has she may have developed a stricture.   Prentice DockerSuresh Analeigha Nauman, M.D., F.A.C.C.

## 2015-03-02 DIAGNOSIS — I481 Persistent atrial fibrillation: Secondary | ICD-10-CM

## 2015-03-02 LAB — BASIC METABOLIC PANEL
Anion gap: 11 (ref 5–15)
BUN: 48 mg/dL — ABNORMAL HIGH (ref 6–23)
CO2: 35 mmol/L — AB (ref 19–32)
CREATININE: 1.34 mg/dL — AB (ref 0.50–1.10)
Calcium: 8.9 mg/dL (ref 8.4–10.5)
Chloride: 89 mmol/L — ABNORMAL LOW (ref 96–112)
GFR calc Af Amer: 48 mL/min — ABNORMAL LOW (ref >90)
GFR calc non Af Amer: 41 mL/min — ABNORMAL LOW (ref >90)
GLUCOSE: 466 mg/dL — AB (ref 70–99)
Potassium: 4.7 mmol/L (ref 3.5–5.1)
SODIUM: 135 mmol/L (ref 135–145)

## 2015-03-02 LAB — CULTURE, BLOOD (ROUTINE X 2)
CULTURE: NO GROWTH
CULTURE: NO GROWTH

## 2015-03-02 LAB — CBC
HEMATOCRIT: 34.9 % — AB (ref 36.0–46.0)
Hemoglobin: 10.3 g/dL — ABNORMAL LOW (ref 12.0–15.0)
MCH: 26.6 pg (ref 26.0–34.0)
MCHC: 29.5 g/dL — AB (ref 30.0–36.0)
MCV: 90.2 fL (ref 78.0–100.0)
Platelets: 232 10*3/uL (ref 150–400)
RBC: 3.87 MIL/uL (ref 3.87–5.11)
RDW: 15.3 % (ref 11.5–15.5)
WBC: 16.1 10*3/uL — ABNORMAL HIGH (ref 4.0–10.5)

## 2015-03-02 LAB — GLUCOSE, CAPILLARY
GLUCOSE-CAPILLARY: 483 mg/dL — AB (ref 70–99)
Glucose-Capillary: 443 mg/dL — ABNORMAL HIGH (ref 70–99)

## 2015-03-02 MED ORDER — INSULIN ASPART 100 UNIT/ML ~~LOC~~ SOLN
10.0000 [IU] | Freq: Once | SUBCUTANEOUS | Status: AC
  Administered 2015-03-02: 10 [IU] via SUBCUTANEOUS

## 2015-03-02 MED ORDER — INSULIN ASPART 100 UNIT/ML ~~LOC~~ SOLN
0.0000 [IU] | Freq: Every day | SUBCUTANEOUS | Status: DC
  Administered 2015-03-03: 4 [IU] via SUBCUTANEOUS

## 2015-03-02 MED ORDER — INSULIN ASPART 100 UNIT/ML ~~LOC~~ SOLN
0.0000 [IU] | Freq: Three times a day (TID) | SUBCUTANEOUS | Status: DC
  Administered 2015-03-02: 20 [IU] via SUBCUTANEOUS
  Administered 2015-03-03: 15 [IU] via SUBCUTANEOUS
  Administered 2015-03-03: 20 [IU] via SUBCUTANEOUS
  Administered 2015-03-03 – 2015-03-04 (×2): 7 [IU] via SUBCUTANEOUS
  Administered 2015-03-04: 20 [IU] via SUBCUTANEOUS
  Administered 2015-03-04: 11 [IU] via SUBCUTANEOUS

## 2015-03-02 MED ORDER — INSULIN ASPART 100 UNIT/ML ~~LOC~~ SOLN
10.0000 [IU] | Freq: Three times a day (TID) | SUBCUTANEOUS | Status: DC
  Administered 2015-03-03 – 2015-03-04 (×6): 10 [IU] via SUBCUTANEOUS

## 2015-03-02 MED ORDER — INSULIN ASPART 100 UNIT/ML ~~LOC~~ SOLN
7.0000 [IU] | Freq: Three times a day (TID) | SUBCUTANEOUS | Status: DC
  Administered 2015-03-02: 7 [IU] via SUBCUTANEOUS

## 2015-03-02 MED ORDER — INSULIN ASPART 100 UNIT/ML ~~LOC~~ SOLN: 15.0000 [IU] | Freq: Once | SUBCUTANEOUS | Status: DC

## 2015-03-02 MED ORDER — INSULIN ASPART 100 UNIT/ML ~~LOC~~ SOLN
20.0000 [IU] | Freq: Once | SUBCUTANEOUS | Status: AC
  Administered 2015-03-02: 20 [IU] via SUBCUTANEOUS

## 2015-03-02 MED ORDER — PREDNISONE 50 MG PO TABS
50.0000 mg | ORAL_TABLET | Freq: Every day | ORAL | Status: DC
  Administered 2015-03-03: 50 mg via ORAL
  Filled 2015-03-02 (×2): qty 1

## 2015-03-02 NOTE — Care Management Note (Signed)
CARE MANAGEMENT NOTE 03/02/2015  Patient:  Rebecca Welch,Rebecca Welch   Account Number:  [id number]  Date Initiated:  02/25/2015  Documentation initiated by:  [facility]  Subjective/Objective Assessment:   COPD, acute resp failure  has home oxygen with [facility], also has an aide.     Action/Plan:   pt eval- rec hhpt   Anticipated DC Date:  03/03/2015   Anticipated DC Plan:  HOME W HOME HEALTH SERVICES      DC Planning Services  CM consult      [facility]   Choice offered to / List presented to:  C-1 Patient        HH arranged  HH-2 PT      [facility]   Status of service:  In process, will continue to follow Medicare Important Message given?  YES (If response is "NO", the following Medicare IM given date fields will be blank) Date Medicare IM given:  02/25/2015 Medicare IM given by:  [facility],ALESIA Date Additional Medicare IM given:   Additional Medicare IM given by:    Discharge Disposition:  HOME W HOME HEALTH SERVICES  Per UR Regulation:    If discussed at Long Length of Stay Meetings, dates discussed:    Comments:  03/02/15 1345 Rebecca PeperSusan Izaya [place]ton, RN BSN Case Manager CM spoke with patient [facility]. [place]erral [place] to [place], [facility] liaison. patient states she has all DME at home, receives her nebulizer and oxygen through [facility]. Patient has family assistance at discharge.

## 2015-03-02 NOTE — Progress Notes (Signed)
Elisabeth Pigeonevine MD paged again for insulin orders. Will await for call back.

## 2015-03-02 NOTE — Progress Notes (Signed)
Patient ID: Rebecca Welch, female   DOB: 10-10-1951, 64 y.o.   MRN: [id number] [facility] PROGRESS NOTE  Rebecca Welch VZC:[id number] DOB: January 27, 1951 DOA: 02/23/2015 PCP: Rebecca Welch  Brief narrative:    64 y.o. female with HTN, GERD, pacemaker placement, CHF, atrial flutter (not on anticoagulation due to history of GI bleeding), history of GI bleeding, history of stroke, asthma, COPD, on home oxygen 2 L, who presented with several days duration of progressive dyspnea with exertion and at rest, associated with wheezing, non productive cough. She also reported LLQ pain over the past week with some nausea but no vomiting.   In ED, patient was hemodynamically stable, CXR notable for pulmonary vascular congestion. CT abdomen/pelvis with non-incarcerated hernia, negative for other acute issues.  Hospital course is complicated with acute decompensated CHF with troponin elevation. Cardiology has seen the pt in consultation. She is currently on IV Lasix.  Assessment/Plan:    Principal Problem: Acute on chronic hypoxic respiratory failure / Acute COPD exacerbation with acute bronchitis  - Likely multifactorial and secondary to acute on chronic COPD with bronchitis, oxygen dependent, acute on chronic diastolic CHF, OSA/OHS - She reports being short of breath this morning. Cardiology has seen her and recommends continuing IV Lasix. She is on Lasix 60 mg IV twice daily. - Continue Xopenex scheduled and as needed - We will switch to prednisone. She is currently on Solu-Medrol IV every 24 hours. - Continue oxygen support via nasal cannula to keep oxygen saturation above 90%.  Acute on chronic diastolic right sided CHF - 2 D ECHO with normal EF and systolic function but severe pulm HTN. Decompensated CHF with right side heart failure also likely due ot OHS. - Continue lasix 60 mg IV BID per cardiology recommendation. - Weight down from 354 lbs to 349 lbs in past 48 hours - Continue  daily weight and replete electrolytes as needed. Strict intake and output.   Elevated troponin - Likely demand ischemia from combination of COPD and CHF - Troponin level normalized    SIRS / Leukocytosis  - Criteria met on admission - Pt with T 99.1 F, RR 24 bpm, BP 104/52, elevated lactic acid  - Levaquin started for presumptive bronchitis. - Leukocytosis likely due to steroids.   Active Problems: Hypertension - Continue lasix 60 mg IV BID  Acute renal failure - Secondary to Lasix - Renal function seems to be stable on current Lasix dose.  Atrial fib/flutter, rate controlled  - CHADS vasc score 3 - not AC candidate due to history of GI bleed - No effect of flecainide noted so per cardio stopped, now on aspirin only   Status post permanent pacemaker - Telemetry showed underlying atrial fibrillation. PVC's noted - Cardio interrogated pacemaker 3/26  Obesity, morbid - Body mass index is 46.81 - Counseled on nutrition  GERD (gastroesophageal reflux disease) - continue Protonix   Abdominal pain - Resolved   Iron deficiency anemia  - No reports of bleeding - Hemoglobin stable   Hyperglycemia, likely steroid induced  - 6.5 - dietary recommendations provided   DVT prophylaxis - Heparin SQ  Code Status: Full.  Family Communication: plan of care discussed with the patient Disposition Plan:    IV access:  Peripheral IV  Procedures and diagnostic studies:    CXR 02/23/2015 Cardiomegaly with mild pulmonary vascular congestion.   Ct Abdomen Pelvis W Contrast 02/23/2015 No acute abnormality is identified. There is midline anterior herniation mesenteric fat and colon in the pelvis without evidence of bowel  incarceration or obstruction. Question enlargement of right ovary for patient age. Recommend further evaluation with pelvic ultrasound on outpatient basis.  Medical Consultants:  Cardiology   Other Consultants:  None    IAnti-Infectives:    Levaquin 3/22 --> Fluconazole 3/22 -->   Rebecca Lenz, MD  [facility] Pager [phone]  If 7PM-7AM, please contact night-coverage [email or web address] Password [facility] 03/02/2015, 11:39 AM   LOS: 6 days    HPI/Subjective: No acute overnight events.  Objective: Filed Vitals:   03/01/15 2317 03/02/15 0524 03/02/15 0555 03/02/15 1001  BP: 98/53 113/62    Pulse:  69    Temp: 99.3 F (37.4 C) 97.6 F (36.4 C)    TempSrc: Oral Oral    Resp:  19    Height:      Weight:  71.759 kg (158 lb 3.2 oz) 158.396 kg (349 lb 3.2 oz)   SpO2:  98%  97%    Intake/Output Summary (Last 24 hours) at 03/02/15 1139 Last data filed at 03/02/15 1101  Gross per 24 hour  Intake   1302 ml  Output   9500 ml  Net  -8198 ml    Exam:   General:  Pt is alert, follows commands appropriately, not in acute distress  Cardiovascular: irregular rhythm, S1/S2 (+)  Respiratory: Diminished breath sounds, wheezing and upper lung lobes  Abdomen: Obese, nontender, non distended, bowel sounds present  Extremities: +1 lower extremity pitting edema, pulses DP and PT palpable bilaterally  Neuro: Grossly nonfocal  Data Reviewed: Basic Metabolic Panel:  Recent Labs Lab 02/23/15 1914  02/26/15 0730 02/27/15 0725 02/28/15 0541 02/28/15 1200 03/01/15 0920 03/02/15 0915  NA  --   < > 138 133* 133*  --  135 135  K  --   < > 4.3 4.5 4.7  --  4.9 4.7  CL  --   < > 98 95* 91*  --  91* 89*  CO2  --   < > 29 29 32  --  31 35*  GLUCOSE  --   < > 247* 386* 461*  --  462* 466*  BUN  --   < > 27* 38* 51*  --  51* 48*  CREATININE  --   < > 1.04 1.33* 1.38*  --  1.34* 1.34*  CALCIUM  --   < > 8.8 8.5 8.3*  --  8.6 8.9  MG 2.0  --   --   --   --  2.4  --   --   < > = values in this interval not displayed. Liver Function Tests:  Recent Labs Lab 02/25/15 0644  AST 20  ALT 16  ALKPHOS 61  BILITOT 0.7  PROT 6.7  ALBUMIN 3.5    Recent Labs Lab 02/24/15 0715  LIPASE 43   No results for input(s):  AMMONIA in the last 168 hours. CBC:  Recent Labs Lab 02/23/15 1850 02/25/15 0644 02/26/15 0730 02/27/15 0725 03/02/15 0915  WBC 6.3 9.0 16.0* 12.7* 16.1*  NEUTROABS 4.8  --   --   --   --   HGB 10.0* 10.0* 10.3* 10.0* 10.3*  HCT 34.8* 34.5* 35.7* 34.3* 34.9*  MCV 93.3 89.6 91.8 90.3 90.2  PLT 182 168 202 202 232   Cardiac Enzymes:  Recent Labs Lab 02/24/15 1130 02/24/15 1946 02/27/15 1127 02/27/15 1628 02/27/15 2159  TROPONINI 0.03 0.03 0.07* 0.03 0.03   BNP: Invalid input(s): POCBNP CBG: No results for input(s): GLUCAP in the last 168 hours.  Recent Results (from the past 240 hour(s))  Respiratory virus panel     Status: None   Collection Time: 02/24/15  5:54 AM  Result Value Ref Range Status   Source - RVPAN NASAL SWAB  Corrected   Respiratory Syncytial Virus A Negative Negative Final   Respiratory Syncytial Virus B Negative Negative Final   Influenza A Negative Negative Final   Influenza B Negative Negative Final   Parainfluenza 1 Negative Negative Final   Parainfluenza 2 Negative Negative Final   Parainfluenza 3 Negative Negative Final   Metapneumovirus Negative Negative Final   Rhinovirus Negative Negative Final   Adenovirus Negative Negative Final    Comment: (NOTE) Performed At: [facility] [place], [place] [id number] Lindon Romp MD [id number]   Culture, blood (routine x 2) Call MD if unable to obtain prior to antibiotics being given     Status: None   Collection Time: 02/24/15  7:15 AM  Result Value Ref Range Status   Specimen Description BLOOD RIGHT ARM  Final   Special Requests BOTTLES DRAWN AEROBIC ONLY 6CC  Final   Culture   Final    NO GROWTH 5 DAYS Performed at [facility]    Report Status 03/02/2015 FINAL  Final  Culture, blood (routine x 2) Call MD if unable to obtain prior to antibiotics being given     Status: None   Collection Time: 02/24/15  7:53 AM  Result Value Ref Range Status   Specimen  Description BLOOD RIGHT ANTECUBITAL  Final   Special Requests BOTTLES DRAWN AEROBIC ONLY 5CC  Final   Culture   Final    NO GROWTH 5 DAYS Performed at [facility]    Report Status 03/02/2015 FINAL  Final  Urine culture     Status: None   Collection Time: 02/24/15  5:13 PM  Result Value Ref Range Status   Specimen Description URINE, RANDOM  Final   Special Requests NONE  Final   Colony Count NO GROWTH Performed at [facility]   Final   Culture NO GROWTH Performed at [facility]   Final   Report Status 02/26/2015 FINAL  Final     Scheduled Meds: . antiseptic oral rinse  7 mL Mouth Rinse BID  . aspirin EC  81 mg Oral Daily  . chlorhexidine  15 mL Mouth Rinse BID  . dextromethorphan-guaiFENesin  1 tablet Oral BID  . fluconazole  100 mg Oral Daily  . furosemide  60 mg Intravenous BID  . heparin  5,000 Units Subcutaneous 3 times per day  . levalbuterol  1.25 mg Nebulization TID  . levofloxacin  500 mg Oral Daily  . methylPREDNISolone (SOLU-MEDROL) injection  40 mg Intravenous Q24H  . pantoprazole  40 mg Oral Daily   Continuous Infusions:

## 2015-03-02 NOTE — Progress Notes (Signed)
Pt had another run of 6 beat vtach. Cardiac cath called for second set of eyes. Per Smokey in Cardiac Cath, tele strip does look like vtach, [employer] representative to be called to check on pacemaker [facility]. Elisabeth Pigeonevine MD made aware and agreeable. Will continue to monitor pt.

## 2015-03-02 NOTE — Progress Notes (Signed)
No call back from MD, after consulting with charge nurse Ginger, RN gave pt 20 units fast acting insulin to cover blood sugar of 443 and 7 units meal coverage. MD made aware.  MD states to give 10 more units of fast acting insulin. Will continue to monitor pt.

## 2015-03-02 NOTE — Progress Notes (Signed)
Inpatient Diabetes Program Recommendations  AACE/ADA: New Consensus Statement on Inpatient Glycemic Control (2013)  Target Ranges:  Prepandial:   less than 140 mg/dL      Peak postprandial:   less than 180 mg/dL (1-2 hours)      Critically ill patients:  140 - 180 mg/dL   Results for Rebecca Welch, Rebecca Welch (MRN [id number]) as of 03/02/2015 12:53  Ref. Range 02/25/2015 06:44 02/26/2015 07:30 02/27/2015 07:25 02/28/2015 05:41 03/01/2015 09:20 03/02/2015 09:15  Hemoglobin A1C Latest Range: 4.8-5.6 % 6.6 (H)       Glucose Latest Range: 70-99 mg/dL 409287 (H) 811247 (H) 914386 (H) 461 (H) 462 (H) 466 (H)   Diabetes history: No Outpatient Diabetes medications: NA Current orders for Inpatient glycemic control: None  Inpatient Diabetes Program Recommendations Correction (SSI): Lab glucose has been  over 441 mg/dl over the past 3 morning. Patient is ordered Solumedrol 40 mg Q24H which is contributing to hyperglycemia.  Please order CBGs with Novolog correction scale.  Thanks, Orlando PennerMarie Sabrinia Prien, RN, MSN, CCRN, CDE Diabetes Coordinator Inpatient Diabetes Program [phone] (Team Pager from 8am to 5pm) [phone] (AP office) [phone] [facility] office)

## 2015-03-02 NOTE — Progress Notes (Signed)
Patient wondering if when discharged if she could have prescription for nebulizer similar to xopenex. Pt states that she currently uses albuterol nebulizer and she feels the xopenex works better. Pt also wondering if she could have TED hose in replacement of the SCDs.

## 2015-03-02 NOTE — Progress Notes (Signed)
Physical Therapy Treatment Patient Details Name: Murlean HarkCarolyn Null MRN: [id number] DOB: March 22, 1951 Today's Date: 03/02/2015    History of Present Illness pt is a 64 y/o female admitted with worsening SOB and abdominal pain.    PT Comments    Pt deferred ambulation today, but agreed to do some exercise.  SpO2 dropped a bit into the 80's on 4L [place], but recovered fairly quickly.  Pt still fairly quick to fatigue.  Follow Up Recommendations  Home health PT     Equipment Recommendations  None recommended by PT    Recommendations for Other Services       Precautions / Restrictions Restrictions Weight Bearing Restrictions: No    Mobility  Bed Mobility Overal bed mobility: Modified Independent                Transfers Overall transfer level: Modified independent   Transfers: Stand Pivot Transfers Sit to Stand: Modified independent (Device/Increase time) Stand pivot transfers: Modified independent (Device/Increase time)       General transfer comment: smooth/safe transitions  Ambulation/Gait                 Stairs            Wheelchair Mobility    Modified Rankin (Stroke Patients Only)       Balance Overall balance assessment: Needs assistance Sitting-balance support: No upper extremity supported;Feet supported Sitting balance-Leahy Scale: Normal     Standing balance support: No upper extremity supported Standing balance-Leahy Scale: Fair                      Cognition Arousal/Alertness: Awake/alert Behavior During Therapy: WFL for tasks assessed/performed Overall Cognitive Status: Within Functional Limits for tasks assessed                      Exercises General Exercises - Lower Extremity Hip ABduction/ADduction: AROM;Strengthening;Both;10 reps;Standing Hip Flexion/Marching: AROM;Strengthening;Both;10 reps;Standing Toe Raises: AROM;Strengthening;Both;10 reps;Standing Heel Raises: AROM;Both;10  reps;Standing Mini-Sqauts: AROM;10 reps;Standing    General Comments General comments (skin integrity, edema, etc.): SpO2 on 4 L Falls during standing exercise 87%  EHR 70'80's;  2nd trial 90% EHR 104 bpm      Pertinent Vitals/Pain Pain Assessment: No/denies pain    Home Living                      Prior Function            PT Goals (current goals can now be found in the care plan section) Acute Rehab PT Goals Patient Stated Goal: Get my heart and lungs under control PT Goal Formulation: With patient Time For Goal Achievement: 03/03/15 Potential to Achieve Goals: Good Progress towards PT goals: Progressing toward goals    Frequency  Min 3X/week    PT Plan Current plan remains appropriate    Co-evaluation             End of Session Equipment Utilized During Treatment: Oxygen Activity Tolerance: Patient tolerated treatment well Patient left: Other (comment);with call bell/phone within reach (sitting EOB)     Time: [id number] PT Time Calculation (min) (ACUTE ONLY): 18 min  Charges:  $Therapeutic Exercise: 8-22 mins                    G Codes:      Shebra Muldrow, Eliseo GumKenneth V 03/02/2015, 1:42 PM  03/02/2015  Briar BingKen Dailin Sosnowski, PT [phone]  (pager)

## 2015-03-02 NOTE — Progress Notes (Signed)
Rebecca Welch with [employer] as been called and made aware. Will arrive around 5pm this afternoon.

## 2015-03-02 NOTE — Progress Notes (Signed)
Rebecca Pigeonevine MD paged x2 regarding blood sugar of 443. Currently awaiting orders for insulin coverage.

## 2015-03-02 NOTE — Progress Notes (Signed)
SUBJECTIVE: Grateful for care she has received. Says she still gets some respiratory distress at times but overall much better.  Says leg swelling improved.     Intake/Output Summary (Last 24 hours) at 03/02/15 0819 Last data filed at 03/02/15 0555  Gross per 24 hour  Intake   1182 ml  Output   7800 ml  Net  -6618 ml    Current Facility-Administered Medications  Medication Dose Route Frequency Provider Last Rate Last Dose  . antiseptic oral rinse (CPC / CETYLPYRIDINIUM CHLORIDE 0.05%) solution 7 mL  7 mL Mouth Rinse BID Dorothea OgleIskra M Myers, MD   7 mL at 03/01/15 1021  . aspirin EC tablet 81 mg  81 mg Oral Daily Lorretta HarpXilin Niu, MD   81 mg at 03/01/15 1017  . chlorhexidine (PERIDEX) 0.12 % solution 15 mL  15 mL Mouth Rinse BID Dorothea OgleIskra M Myers, MD   15 mL at 03/01/15 2113  . dextromethorphan-guaiFENesin (MUCINEX DM) 30-600 MG per 12 hr tablet 1 tablet  1 tablet Oral BID Lorretta HarpXilin Niu, MD   1 tablet at 03/01/15 2109  . fluconazole (DIFLUCAN) tablet 100 mg  100 mg Oral Daily Dorothea OgleIskra M Myers, MD   100 mg at 03/01/15 1019  . furosemide (LASIX) injection 60 mg  60 mg Intravenous BID Laqueta LindenSuresh A Koneswaran, MD   60 mg at 03/02/15 0804  . heparin injection 5,000 Units  5,000 Units Subcutaneous 3 times per day Lorretta HarpXilin Niu, MD   5,000 Units at 02/25/15 1457  . HYDROcodone-acetaminophen (NORCO/VICODIN) 5-325 MG per tablet 1 tablet  1 tablet Oral Q4H PRN Lorretta HarpXilin Niu, MD   1 tablet at 03/02/15 0445  . levalbuterol (XOPENEX) nebulizer solution 1.25 mg  1.25 mg Nebulization Q4H PRN Dorothea OgleIskra M Myers, MD   1.25 mg at 02/28/15 0335  . levalbuterol (XOPENEX) nebulizer solution 1.25 mg  1.25 mg Nebulization TID Dorothea OgleIskra M Myers, MD   1.25 mg at 03/01/15 2116  . levofloxacin (LEVAQUIN) tablet 500 mg  500 mg Oral Daily Lorretta HarpXilin Niu, MD   500 mg at 03/01/15 1019  . methocarbamol (ROBAXIN) tablet 500 mg  500 mg Oral Q8H PRN Lorretta HarpXilin Niu, MD      . methylPREDNISolone sodium succinate (SOLU-MEDROL) 40 mg/mL injection 40 mg  40 mg Intravenous  Q24H Alison MurrayAlma M Devine, MD   40 mg at 03/01/15 1510  . pantoprazole (PROTONIX) EC tablet 40 mg  40 mg Oral Daily Lorretta HarpXilin Niu, MD   40 mg at 03/01/15 1020    Filed Vitals:   03/01/15 2306 03/01/15 2317 03/02/15 0524 03/02/15 0555  BP: 146/76 98/53 113/62   Pulse: 69  69   Temp: 97.9 F (36.6 C) 99.3 F (37.4 C) 97.6 F (36.4 C)   TempSrc:  Oral Oral   Resp: 18  19   Height:      Weight:   158 lb 3.2 oz (71.759 kg) 349 lb 3.2 oz (158.396 kg)  SpO2: 100%  98%     PHYSICAL EXAM General: NAD, on nasal Bipap now HEENT: Normal. Neck: No JVD, no thyromegaly.  Lungs: Diminished b/l, no obvious rales. CV: Regular rate and rhythm, normal S1/S2, no S3/S4, no murmur. 1+ pretibial edema.  Abdomen: Obese.  Neurologic: Alert and oriented x 3.  Psych: Normal affect. Musculoskeletal: No gross deformities. Extremities: No clubbing or cyanosis.   TELEMETRY: Reviewed telemetry pt in atrial fibrillation with V pacing. Rate fairly well controlled.  LABS: Basic Metabolic Panel:  Recent Labs  96/03/5402/26/16  0541 02/28/15 1200 03/01/15 0920  NA 133*  --  135  K 4.7  --  4.9  CL 91*  --  91*  CO2 32  --  31  GLUCOSE 461*  --  462*  BUN 51*  --  51*  CREATININE 1.38*  --  1.34*  CALCIUM 8.3*  --  8.6  MG  --  2.4  --    Liver Function Tests: No results for input(s): AST, ALT, ALKPHOS, BILITOT, PROT, ALBUMIN in the last 72 hours. No results for input(s): LIPASE, AMYLASE in the last 72 hours. CBC: No results for input(s): WBC, NEUTROABS, HGB, HCT, MCV, PLT in the last 72 hours. Cardiac Enzymes:  Recent Labs  02/27/15 1127 02/27/15 1628 02/27/15 2159  TROPONINI 0.07* 0.03 0.03   BNP: Invalid input(s): POCBNP D-Dimer: No results for input(s): DDIMER in the last 72 hours. Hemoglobin A1C: No results for input(s): HGBA1C in the last 72 hours. Fasting Lipid Panel: No results for input(s): CHOL, HDL, LDLCALC, TRIG, CHOLHDL, LDLDIRECT in the last 72 hours. Thyroid Function Tests: No  results for input(s): TSH, T4TOTAL, T3FREE, THYROIDAB in the last 72 hours.  Invalid input(s): FREET3 Anemia Panel: No results for input(s): VITAMINB12, FOLATE, FERRITIN, TIBC, IRON, RETICCTPCT in the last 72 hours.  RADIOLOGY: Dg Chest 2 View  02/25/2015   CLINICAL DATA:  Shortness of breath, congestion.  EXAM: CHEST  2 VIEW  COMPARISON:  02/23/2015  FINDINGS: Pacer remains in place, unchanged. There is cardiomegaly with vascular congestion. No overt edema. No confluent opacities. Probable small right pleural effusion. No acute bony abnormality.  IMPRESSION: Cardiomegaly with vascular congestion.  Small right effusion.   Electronically Signed   By: Charlett Nose M.D.   On: 02/25/2015 17:31   Dg Chest 2 View  02/23/2015   CLINICAL DATA:  Shortness of breath, history of atrial flutter  EXAM: CHEST  2 VIEW  COMPARISON:  07/27/2014  FINDINGS: There is mild bilateral interstitial thickening. There is no pleural effusion or pneumothorax. There is a dual lead cardiac pacer. There is stable cardiomegaly.  The osseous structures are unremarkable.  IMPRESSION: Cardiomegaly with mild pulmonary vascular congestion.   Electronically Signed   By: Elige Ko   On: 02/23/2015 19:36   Ct Abdomen Pelvis W Contrast  02/23/2015   CLINICAL DATA:  Lower abdomen pain for 2-3 weeks with nausea and diarrhea  EXAM: CT ABDOMEN AND PELVIS WITH CONTRAST  TECHNIQUE: Multidetector CT imaging of the abdomen and pelvis was performed using the standard protocol following bolus administration of intravenous contrast.  CONTRAST:  25mL OMNIPAQUE IOHEXOL 300 MG/ML SOLN, OMNIPAQUE IOHEXOL 300 MG/ML SOLN  COMPARISON:  None.  FINDINGS: There is diffuse fatty infiltration of liver. No focal liver lesion is identified. The spleen, pancreas, gallbladder, adrenal glands are normal. There are simple cysts in both kidneys, largest in the posterior midpole right kidney measuring 2.6 x 3.7 cm. There is no hydronephrosis bilaterally. There is  atherosclerosis of the abdominal aorta without aneurysmal dilatation. There is no abdominal lymphadenopathy. There is venous phleboliths anterior to the mid to distal right ureter.  There is no small bowel obstruction. There is a small hiatal hernia. There is midline anterior herniation of mesenteric fat and colon in the pelvis without evidence of bowel incarceration or obstruction. There is subcutaneous fat stranding of the anterior lower pelvis.  Images of the pelvis demonstrate partial decompressed bladder. There is question enlargement of the right ovary measuring 3.3 x 7.3 cm. There is  consolidation of the posterior right lower lobe. Degenerative joint changes of the spine are noted.  IMPRESSION: No acute abnormality is identified.  There is midline anterior herniation mesenteric fat and colon in the pelvis without evidence of bowel incarceration or obstruction.  Question enlargement of right ovary for patient age. Recommend further evaluation with pelvic ultrasound on outpatient basis.   Electronically Signed   By: Sherian Rein M.D.   On: 02/23/2015 22:08      ASSESSMENT AND PLAN: 1. 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 I suspect this is multifactorial with a component related to obesity/hypoventilation syndrome, and she also clearly has significant lung disease based on her history and physical exam findings. I think diuresis will be the mainstay of therapy.  Has diuresed 6.6 L on Lasix 60 mg IV bid in last 24 hours. she is negative 12 liters since admission. Weight really hasn't changed much.  Renal function is about the same.  As she's changed back to oral diuretics, she may benefit from the addition of Aldactone in conjunction with  Lasix if renal function allows.  2. Status post permanent pacemaker: Telemetry shows underlying atrial fibrillation. Magnesium checked on 3/26 and was normal. Pacemaker interrogation on 3/26 showed atrial fibrillation ongoing since 07/2014 and V-pacing >90%. Flecainide d/c on 3/26.  3. Morbid obesity  4. 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  5. Atrial fibrillation, suspect chronic: No anticoagulation due to h/o GI bleeding on warfarin in past. Continue ASA 81 mg. No obvious benefit from flecainide so discontinued on 3/26. Pacemaker interrogation completed on 3/26.   6. Troponin elevation x 1 : Now normalized, and likely related to demand ischemia. Chest discomfort related to bronchitis and esophageal dysphagia rather than CAD.  7. Dysphagia for solids: Would recommend outpatient EGD as she may have developed a stricture.

## 2015-03-02 NOTE — Progress Notes (Signed)
Pt showing possible vtach on tele monitor. Elisabeth Pigeonevine MD made aware.

## 2015-03-03 LAB — GLUCOSE, CAPILLARY
Glucose-Capillary: 256 mg/dL — ABNORMAL HIGH (ref 70–99)
Glucose-Capillary: 309 mg/dL — ABNORMAL HIGH (ref 70–99)
Glucose-Capillary: 353 mg/dL — ABNORMAL HIGH (ref 70–99)
Glucose-Capillary: 222 mg/dL — ABNORMAL HIGH (ref 70–99)
Glucose-Capillary: 303 mg/dL — ABNORMAL HIGH (ref 70–99)

## 2015-03-03 MED ORDER — INSULIN GLARGINE 100 UNIT/ML ~~LOC~~ SOLN
25.0000 [IU] | Freq: Every day | SUBCUTANEOUS | Status: DC
  Administered 2015-03-03: 25 [IU] via SUBCUTANEOUS
  Filled 2015-03-03 (×2): qty 0.25

## 2015-03-03 MED ORDER — PREDNISONE 20 MG PO TABS
30.0000 mg | ORAL_TABLET | Freq: Every day | ORAL | Status: DC
  Administered 2015-03-04: 30 mg via ORAL
  Filled 2015-03-03 (×2): qty 1

## 2015-03-03 NOTE — Progress Notes (Signed)
Patient Name: Rebecca Welch Date of Encounter: 03/03/2015  Principal Problem:   SOB (shortness of breath) Active Problems:   Dyspnea   Hypertension   CHF (congestive heart failure)   Atrial flutter   Obesity   Stroke   GERD (gastroesophageal reflux disease)   COPD exacerbation   Asthma with acute exacerbation   Abdominal pain   OSA (obstructive sleep apnea)   Acute on chronic diastolic congestive heart failure   Primary Cardiologist: Dr. Rudolpho SevinAkbary  Patient Profile: 64 yo female w/ hx of HTN, GERD, [employer] PPM, D-CHF, atrial flutter (not on anticoagulation due to history of GI bleeding), hx CVA, asthma, COPD(on home oxygen 2 L) who presented on 02/23/15 with progressive shortness of breath.    SUBJECTIVE: Still having weakness and SOB on and off but feels it has greatly improved since admission. She is still having orthopnea at times but she feels that has gotten better since admission. No CP but feels her heart "quivering" at times. She states she has been coughing up a thick green sputum.  OBJECTIVE Filed Vitals:   03/02/15 2117 03/03/15 0552 03/03/15 0626 03/03/15 1131  BP: 131/64 128/63    Pulse: 70 69    Temp: 98.7 F (37.1 C) 98.7 F (37.1 C)    TempSrc: Oral Oral    Resp: 14 15    Height:      Weight:   350 lb 8.5 oz (159 kg)   SpO2: 95% 96%  98%    Intake/Output Summary (Last 24 hours) at 03/03/15 1324 Last data filed at 03/03/15 1112  Gross per 24 hour  Intake    870 ml  Output   4500 ml  Net  -3630 ml   Filed Weights   03/02/15 0524 03/02/15 0555 03/03/15 0626  Weight: 158 lb 3.2 oz (71.759 kg) 349 lb 3.2 oz (158.396 kg) 350 lb 8.5 oz (159 kg)    PHYSICAL EXAM General: Well developed, well nourished, female in no acute distress. Head: Normocephalic, atraumatic.  Neck: Supple without bruits, no JVD. Lungs:  Resp regular and unlabored, CTA. Heart: Irregular, no S3, S4, or murmur; no rub. Abdomen: Soft, non-tender, non-distended, BS + x 4.    Extremities: No clubbing, cyanosis, +2 pitting edema to BLE.  Neuro: Alert and oriented X 3. Moves all extremities spontaneously. Psych: Normal affect.  LABS: CBC:  Recent Labs  03/02/15 0915  WBC 16.1*  HGB 10.3*  HCT 34.9*  MCV 90.2  PLT 232   Basic Metabolic Panel:  Recent Labs  82/95/6203/27/16 0920 03/02/15 0915  NA 135 135  K 4.9 4.7  CL 91* 89*  CO2 31 35*  GLUCOSE 462* 466*  BUN 51* 48*  CREATININE 1.34* 1.34*  CALCIUM 8.6 8.9   BNP:  B NATRIURETIC PEPTIDE  Date/Time Value Ref Range Status  02/23/2015 06:50 PM 119.4* 0.0 - 100.0 pg/mL Final    TELE:  A. Fib, V-Paced rhythm, 69. Some brief (asymptomatic) episodes high heart rate, may not have triggered the PPM since rate approx 150  Current Medications:  . antiseptic oral rinse  7 mL Mouth Rinse BID  . aspirin EC  81 mg Oral Daily  . chlorhexidine  15 mL Mouth Rinse BID  . dextromethorphan-guaiFENesin  1 tablet Oral BID  . fluconazole  100 mg Oral Daily  . furosemide  60 mg Intravenous BID  . heparin  5,000 Units Subcutaneous 3 times per day  . insulin aspart  0-20 Units Subcutaneous TID WC  .  insulin aspart  0-5 Units Subcutaneous QHS  . insulin aspart  10 Units Subcutaneous TID WC  . insulin glargine  25 Units Subcutaneous Daily  . levalbuterol  1.25 mg Nebulization TID  . levofloxacin  500 mg Oral Daily  . pantoprazole  40 mg Oral Daily  . 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 Fib w/ V pacing -CHA2DS2VASC Score: 5  -Pacemaker interrogation on 3/28 showed atrial fibrillation ongoing since 07/2014 and V-pacing >90%. Flecainide d/c on 3/26. - several ?NSVT episodes noted, see strips - True PVCs are very wide and different from rapid HR noted - device is programmed to notice > 8 bts if HR > 160 bpm, may just be rapid atrial fib or atrial tach -Not a candidate for anticoagulation due to history of GI bleed    02 dependent COPD -oxygen saturation stable on home oxygen level of 2 L -per IM   Hypertension -Currently normotensive -Home Cozaar discontinued per IM -Currently not ordered any antihypertensive medication    GERD (gastroesophageal reflux disease) -Continue Protonix -per IM     Abdominal pain -No complaints of abdominal pain at present -per IM    OSA (obstructive sleep apnea) -CPAP    Leukocytosis -per IM    Hyperglycemia -per IM - A1c was 6.6    Signed, Rhonda Barrett , PA-C 1:24 PM 03/03/2015  Patient seen and examined and history reviewed. Agree with above findings and plan. Doing well. Excellent diuresis. Now becoming alkalotic. Will switch lasix to po today and see if we can maintain weight and diuresis. She has several short runs of NSVT - 7 beats. These are asymptomatic.  [name], MDFACC 03/03/2015 1:36 PM

## 2015-03-03 NOTE — Progress Notes (Addendum)
Patient ID: Rebecca Welch, female   DOB: 09/12/1951, 64 y.o.   MRN: [id number] [facility] PROGRESS NOTE  Rebecca Welch SJG:[id number] DOB: 1951/03/28 DOA: 02/23/2015 PCP: Marry Guan  Brief narrative:    64 y.o. female with HTN, GERD, pacemaker placement, CHF, atrial flutter (not on anticoagulation due to history of GI bleeding), history of GI bleeding, history of stroke, asthma, COPD, on home oxygen 2 L, who presented with several days duration of progressive dyspnea with exertion and at rest, associated with wheezing, non productive cough. She also reported LLQ pain over the past week with some nausea but no vomiting.   In ED, patient was hemodynamically stable, CXR notable for pulmonary vascular congestion. CT abdomen/pelvis with non-incarcerated hernia, negative for other acute issues.  Hospital course is complicated with acute decompensated CHF with troponin elevation. Cardiology has seen the pt in consultation. She is currently on IV Lasix.  Assessment/Plan:    Acute on chronic hypoxic respiratory failure / Acute COPD exacerbation with acute bronchitis  - Likely multifactorial and secondary to acute on chronic COPD with bronchitis, oxygen dependent, acute on chronic diastolic CHF, OSA/OHS   Cardiology  recommends continuing IV Lasix. She is on Lasix 60 mg IV twice daily. - Continue Xopenex scheduled and as needed taper prednisone to 30 mg .  - Continue oxygen support via nasal cannula to keep oxygen saturation above 90%.  Acute on chronic diastolic right sided CHF - 2 D ECHO with normal EF and systolic function but severe pulm HTN. Decompensated CHF with right side heart failure also likely due ot OHS. On lasix 60 mg IV BID per cardiology recommendation.Now becoming alkalotic. Will switch lasix to po today and see if we can maintain weight and diuresis - Weight down from 354 lbs to 349 lbs in past 48 hours - Continue daily weight and replete electrolytes as needed.  Strict intake and output.   Elevated troponin - Likely demand ischemia from combination of COPD and CHF - Troponin level normalized    SIRS / Leukocytosis  - Criteria met on admission - Pt with T 99.1 F, RR 24 bpm, BP 104/52, elevated lactic acid  - Levaquin started for presumptive bronchitis. - Leukocytosis likely due to steroids.   Dysphagia for solids: Would recommend outpatient EGD as she may have developed a stricture  Hypertension - Continue lasix 60 mg PO BID  Acute renal failure - Secondary to Lasix - Renal function seems to be stable on current Lasix dose.  Atrial fib/flutter, rate controlled  - CHADS vasc score 3 - not AC candidate due to history of GI bleed Pacemaker interrogation on 3/26 showed atrial fibrillation ongoing since 07/2014 and V-pacing >90%. Flecainide d/c on 3/26.  Status post permanent pacemaker - Telemetry showed underlying atrial fibrillation. PVC's noted - Cardio interrogated pacemaker 3/26  Obesity, morbid - Body mass index is 46.81 - Counseled on nutrition  GERD (gastroesophageal reflux disease) - continue Protonix   Abdominal pain - Resolved   Iron deficiency anemia  - No reports of bleeding - Hemoglobin stable   Hyperglycemia, likely steroid induced  - 6.5 Start lantus 25U Monitor cbg for another day   DVT prophylaxis - Heparin SQ  Code Status: Full.  Family Communication: plan of care discussed with the patient Disposition Plan:    IV access:  Peripheral IV  Procedures and diagnostic studies:    CXR 02/23/2015 Cardiomegaly with mild pulmonary vascular congestion.   Ct Abdomen Pelvis W Contrast 02/23/2015 No acute abnormality is identified. There  is midline anterior herniation mesenteric fat and colon in the pelvis without evidence of bowel incarceration or obstruction. Question enlargement of right ovary for patient age. Recommend further evaluation with pelvic ultrasound on outpatient  basis.  Medical Consultants:  Cardiology   Other Consultants:  None    IAnti-Infectives:   Levaquin 3/22 --> Fluconazole 3/22 -->   Reyne Dumas, MD  [facility] Pager [phone]  If 7PM-7AM, please contact night-coverage [email or web address] Password [facility] 03/03/2015, 11:51 AM   LOS: 7 days    HPI/Subjective: Says she still gets some respiratory distress at times but overall much better  Objective: Filed Vitals:   03/02/15 2117 03/03/15 0552 03/03/15 0626 03/03/15 1131  BP: 131/64 128/63    Pulse: 70 69    Temp: 98.7 F (37.1 C) 98.7 F (37.1 C)    TempSrc: Oral Oral    Resp: 14 15    Height:      Weight:   159 kg (350 lb 8.5 oz)   SpO2: 95% 96%  98%    Intake/Output Summary (Last 24 hours) at 03/03/15 1151 Last data filed at 03/03/15 1112  Gross per 24 hour  Intake   2220 ml  Output   4500 ml  Net  -2280 ml    Exam:   General:  Pt is alert, follows commands appropriately, not in acute distress  Cardiovascular: irregular rhythm, S1/S2 (+)  Respiratory: Diminished breath sounds, wheezing and upper lung lobes  Abdomen: Obese, nontender, non distended, bowel sounds present  Extremities: +1 lower extremity pitting edema, pulses DP and PT palpable bilaterally  Neuro: Grossly nonfocal  Data Reviewed: Basic Metabolic Panel:  Recent Labs Lab 02/26/15 0730 02/27/15 0725 02/28/15 0541 02/28/15 1200 03/01/15 0920 03/02/15 0915  NA 138 133* 133*  --  135 135  K 4.3 4.5 4.7  --  4.9 4.7  CL 98 95* 91*  --  91* 89*  CO2 29 29 32  --  31 35*  GLUCOSE 247* 386* 461*  --  462* 466*  BUN 27* 38* 51*  --  51* 48*  CREATININE 1.04 1.33* 1.38*  --  1.34* 1.34*  CALCIUM 8.8 8.5 8.3*  --  8.6 8.9  MG  --   --   --  2.4  --   --    Liver Function Tests:  Recent Labs Lab 02/25/15 0644  AST 20  ALT 16  ALKPHOS 61  BILITOT 0.7  PROT 6.7  ALBUMIN 3.5   No results for input(s): LIPASE, AMYLASE in the last 168 hours. No results for input(s): AMMONIA  in the last 168 hours. CBC:  Recent Labs Lab 02/25/15 0644 02/26/15 0730 02/27/15 0725 03/02/15 0915  WBC 9.0 16.0* 12.7* 16.1*  HGB 10.0* 10.3* 10.0* 10.3*  HCT 34.5* 35.7* 34.3* 34.9*  MCV 89.6 91.8 90.3 90.2  PLT 168 202 202 232   Cardiac Enzymes:  Recent Labs Lab 02/24/15 1946 02/27/15 1127 02/27/15 1628 02/27/15 2159  TROPONINI 0.03 0.07* 0.03 0.03   BNP: Invalid input(s): POCBNP CBG:  Recent Labs Lab 03/02/15 1719 03/02/15 2111 03/03/15 0513 03/03/15 0750  GLUCAP 443* 483* 256* 309*    Recent Results (from the past 240 hour(s))  Respiratory virus panel     Status: None   Collection Time: 02/24/15  5:54 AM  Result Value Ref Range Status   Source - RVPAN NASAL SWAB  Corrected   Respiratory Syncytial Virus A Negative Negative Final   Respiratory Syncytial Virus B Negative Negative Final  Influenza A Negative Negative Final   Influenza B Negative Negative Final   Parainfluenza 1 Negative Negative Final   Parainfluenza 2 Negative Negative Final   Parainfluenza 3 Negative Negative Final   Metapneumovirus Negative Negative Final   Rhinovirus Negative Negative Final   Adenovirus Negative Negative Final    Comment: (NOTE) Performed At: [facility] [place], [place] [id number] Lindon Romp MD OZ:[id number]   Culture, blood (routine x 2) Call MD if unable to obtain prior to antibiotics being given     Status: None   Collection Time: 02/24/15  7:15 AM  Result Value Ref Range Status   Specimen Description BLOOD RIGHT ARM  Final   Special Requests BOTTLES DRAWN AEROBIC ONLY [place]  Final   Culture   Final    NO GROWTH 5 DAYS Performed at [facility]    Report Status 03/02/2015 FINAL  Final  Culture, blood (routine x 2) Call MD if unable to obtain prior to antibiotics being given     Status: None   Collection Time: 02/24/15  7:53 AM  Result Value Ref Range Status   Specimen Description BLOOD RIGHT ANTECUBITAL  Final    Special Requests BOTTLES DRAWN AEROBIC ONLY 5CC  Final   Culture   Final    NO GROWTH 5 DAYS Performed at [facility]    Report Status 03/02/2015 FINAL  Final  Urine culture     Status: None   Collection Time: 02/24/15  5:13 PM  Result Value Ref Range Status   Specimen Description URINE, RANDOM  Final   Special Requests NONE  Final   Colony Count NO GROWTH Performed at [facility]   Final   Culture NO GROWTH Performed at [facility]   Final   Report Status 02/26/2015 FINAL  Final     Scheduled Meds: . antiseptic oral rinse  7 mL Mouth Rinse BID  . aspirin EC  81 mg Oral Daily  . chlorhexidine  15 mL Mouth Rinse BID  . dextromethorphan-guaiFENesin  1 tablet Oral BID  . fluconazole  100 mg Oral Daily  . furosemide  60 mg Intravenous BID  . heparin  5,000 Units Subcutaneous 3 times per day  . insulin aspart  0-20 Units Subcutaneous TID WC  . insulin aspart  0-5 Units Subcutaneous QHS  . insulin aspart  10 Units Subcutaneous TID WC  . insulin glargine  25 Units Subcutaneous Daily  . levalbuterol  1.25 mg Nebulization TID  . levofloxacin  500 mg Oral Daily  . pantoprazole  40 mg Oral Daily  . predniSONE  50 mg Oral Q breakfast   Continuous Infusions:

## 2015-03-04 DIAGNOSIS — R0602 Shortness of breath: Secondary | ICD-10-CM

## 2015-03-04 LAB — COMPREHENSIVE METABOLIC PANEL
ALBUMIN: 3.2 g/dL — AB (ref 3.5–5.2)
ALK PHOS: 50 U/L (ref 39–117)
ALT: 20 U/L (ref 0–35)
ALT: 20 U/L (ref 0–35)
ANION GAP: 11 (ref 5–15)
AST: 15 U/L (ref 0–37)
AST: 17 U/L (ref 0–37)
Albumin: 3.3 g/dL — ABNORMAL LOW (ref 3.5–5.2)
Alkaline Phosphatase: 51 U/L (ref 39–117)
Anion gap: 11 (ref 5–15)
BILIRUBIN TOTAL: 0.6 mg/dL (ref 0.3–1.2)
BUN: 44 mg/dL — AB (ref 6–23)
BUN: 43 mg/dL — AB (ref 6–23)
CALCIUM: 8.8 mg/dL (ref 8.4–10.5)
CO2: 39 mmol/L — AB (ref 19–32)
CO2: 38 mmol/L — ABNORMAL HIGH (ref 19–32)
Calcium: 9 mg/dL (ref 8.4–10.5)
Chloride: 88 mmol/L — ABNORMAL LOW (ref 96–112)
Chloride: 89 mmol/L — ABNORMAL LOW (ref 96–112)
Creatinine, Ser: 1.29 mg/dL — ABNORMAL HIGH (ref 0.50–1.10)
Creatinine, Ser: 1.23 mg/dL — ABNORMAL HIGH (ref 0.50–1.10)
GFR calc Af Amer: 53 mL/min — ABNORMAL LOW (ref >90)
GFR calc non Af Amer: 43 mL/min — ABNORMAL LOW (ref >90)
GFR, EST AFRICAN AMERICAN: 50 mL/min — AB (ref >90)
GFR, EST NON AFRICAN AMERICAN: 46 mL/min — AB (ref >90)
GLUCOSE: 266 mg/dL — AB (ref 70–99)
GLUCOSE: 221 mg/dL — AB (ref 70–99)
Potassium: 4.3 mmol/L (ref 3.5–5.1)
Potassium: 4.2 mmol/L (ref 3.5–5.1)
SODIUM: 138 mmol/L (ref 135–145)
Sodium: 138 mmol/L (ref 135–145)
Total Bilirubin: 0.6 mg/dL (ref 0.3–1.2)
Total Protein: 5.5 g/dL — ABNORMAL LOW (ref 6.0–8.3)
Total Protein: 5.6 g/dL — ABNORMAL LOW (ref 6.0–8.3)

## 2015-03-04 LAB — GLUCOSE, CAPILLARY
GLUCOSE-CAPILLARY: 225 mg/dL — AB (ref 70–99)
GLUCOSE-CAPILLARY: 355 mg/dL — AB (ref 70–99)
Glucose-Capillary: 285 mg/dL — ABNORMAL HIGH (ref 70–99)

## 2015-03-04 MED ORDER — INSULIN GLARGINE 100 UNIT/ML ~~LOC~~ SOLN
30.0000 [IU] | Freq: Every day | SUBCUTANEOUS | Status: DC
  Administered 2015-03-04: 30 [IU] via SUBCUTANEOUS
  Filled 2015-03-04: qty 0.3

## 2015-03-04 MED ORDER — DM-GUAIFENESIN ER 30-600 MG PO TB12: 1.0000 | ORAL_TABLET | Freq: Two times a day (BID) | ORAL | Status: DC

## 2015-03-04 MED ORDER — LEVALBUTEROL HCL 1.25 MG/0.5ML IN NEBU: 1.2500 mg | INHALATION_SOLUTION | RESPIRATORY_TRACT | Status: DC | PRN

## 2015-03-04 MED ORDER — PREDNISONE 10 MG PO TABS: ORAL_TABLET | ORAL | Status: DC

## 2015-03-04 MED ORDER — FUROSEMIDE 20 MG PO TABS: 60.0000 mg | ORAL_TABLET | Freq: Two times a day (BID) | ORAL | Status: AC

## 2015-03-04 MED ORDER — INSULIN GLARGINE 100 UNIT/ML ~~LOC~~ SOLN: 25.0000 [IU] | Freq: Every day | SUBCUTANEOUS | Status: AC

## 2015-03-04 NOTE — Progress Notes (Addendum)
     Cleared from a cardiology standpoint. She does not want to follow with her old primary cardiologist Dr Rudolpho SevinAkbary in [place]. She would like to follow with us. I will have this arranged.    Cline CrockKathryn Thompson PA-C  [facility]

## 2015-03-04 NOTE — Progress Notes (Signed)
Inpatient Diabetes Program Recommendations  AACE/ADA: New Consensus Statement on Inpatient Glycemic Control (2013)  Target Ranges:  Prepandial:   less than 140 mg/dL      Peak postprandial:   less than 180 mg/dL (1-2 hours)      Critically ill patients:  140 - 180 mg/dL    Results for Murlean HarkROBERTSON, Carolle (MRN [id number]) as of 03/04/2015 09:56  Ref. Range 02/25/2015 06:44  Hemoglobin A1C Latest Range: 4.8-5.6 % 6.6 (H)   Results for Murlean HarkROBERTSON, Danayah (MRN [id number]) as of 03/04/2015 09:56  Ref. Range 03/03/2015 07:50 03/03/2015 11:45 03/03/2015 17:34 03/03/2015 20:50 03/04/2015 07:57  Glucose-Capillary Latest Range: 70-99 mg/dL 782309 (H) 956353 (H) 213222 (H) 303 (H) 225 (H)   Diabetes history: No Outpatient Diabetes medications: NA Current orders for Inpatient glycemic control: Lantus 30 units daily, Novolog 10 units TID with meals for meal coverage, Novolog 0-20 units TID with meals, Novolog 0-5 units HS  Inpatient Diabetes Program Recommendations Insulin - Basal: Note Lantus was increased from 25 units to 30 units daily and steroids have been decreased to Prednisone 30 mg QAM. HgbA1C: A1C 6.6% on 02/25/15 which meets ADA criteria for DM diagnosis. Noted MD notes hyperglycemia likely steroid induced. MD, please indicate if patient will be diagnosed with diabetes or not so that patient can be educated on diabetes prior to discharge. If so, please inform Nursing staff and consult Diabetes Coordinator.  Thanks, Orlando PennerMarie Sakai Heinle, RN, MSN, CCRN, CDE Diabetes Coordinator Inpatient Diabetes Program [phone] (Team Pager from 8am to 5pm) [phone] ([facility] office) [phone] [facility] office)

## 2015-03-04 NOTE — Progress Notes (Signed)
Patient discharge teaching given, including activity, diet, follow-up appoints, and medications. Patient verbalized understanding of all discharge instructions. IV access was d/c'd. Vitals are stable. Skin is intact except as charted in most recent assessments. Pt to be escorted out by RN, to be driven home by family.  

## 2015-03-04 NOTE — Progress Notes (Signed)
Patient discharge teaching given, including activity, diet, follow-up appoints, and medications. Patient verbalized understanding of all discharge instructions. IV access was d/c'd. Vitals are stable. Skin is intact except as charted in most recent assessments. Pt to be escorted out by NT, to be driven home by family. 

## 2015-03-04 NOTE — Progress Notes (Signed)
SATURATION QUALIFICATIONS: (This note is used to comply with regulatory documentation for home oxygen)  Patient Saturations on Room Air at Rest = 92 %  Patient Saturations on Room Air while Ambulating = 88%  Patient Saturations on 2 Liters of oxygen while Ambulating = 91%  Please briefly explain why patient needs home oxygen: 

## 2015-03-04 NOTE — Progress Notes (Signed)
CARE MANAGEMENT NOTE 03/04/2015  Patient:  Rebecca Welch,Rebecca   Account Number:  [id number]  Date Initiated:  02/25/2015  Documentation initiated by:  [facility]ALESIA  Subjective/Objective Assessment:   COPD, acute resp failure  has home oxygen with [facility], also has an aide.     Action/Plan:   pt eval- rec hhpt   Anticipated DC Date:  03/04/2015   Anticipated DC Plan:  HOME W HOME HEALTH SERVICES      DC Planning Services  CM consult      [facility]   Choice offered to / List presented to:  C-1 Patient        HH arranged  HH-2 PT  HH-1 RN  HH-3 OT  HH-4 NURSE'S AIDE      HH agency  [facility]   Status of service:  Completed, signed off Medicare Important Message given?  YES (If response is "NO", the following Medicare IM given date fields will be blank) Date Medicare IM given:  02/25/2015 Medicare IM given by:  [facility]AL[facility] Date Additional Medicare IM given:  03/04/2015 Additional Medicare IM given by:  Jacquelynn CreeMARY Mckenley Birenbaum  [facility]:  [facility]  Per UR Regulation:  Reviewed for med. necessity/level of care/duration of stay  If discussed at Long Length of Stay Meetings, dates discussed:    Comments:  03/04/15 Contacted Miranda at [facility] and [facility] HHOT and aide in addition to HHPT.  03/02/15 1345 Vance PeperSusan Brady, RN BSN Case Manager CM spoke with patient concerning [facility]. [place]erral [place] to [place], [facility] liaison. patient states she has all DME at home, receives her nebulizer and oxygen through [facility]. Patient has family assistance at discharge.   02/26/15 1806 Letha Capeeborah Taylor RN ,BSN [phone] patient has hh angency list will need to choose agency for hhpt.   02/25/2015 1400 NCM spoke to pt and [facility] Instructed pt to bring a portable at time of dc. States she does have port tanks at home. She has aide that comes from [facility] for 2.5 hours per day on Mon-Fri.  Provided pt with HHA list for possible HH PT at home. [facility] She has RW, 3n1, tub bench and neb machine at home. Isidoro DonningAlesia Shavis RN CCM Case Mgmt phone [phone]

## 2015-03-04 NOTE — Discharge Summary (Signed)
Physician Discharge Summary  Rebecca Welch MRN: [id number] DOB/AGE: [birth year] 64 y.o.  PCP: Marry Guan   Admit date: 02/23/2015 Discharge date: 03/04/2015  Discharge Diagnoses:     Steroid-induced hyperglycemia   SOB (shortness of breath) Active Problems:   Dyspnea   Hypertension   CHF (congestive heart failure)   Atrial flutter   Obesity   Stroke   GERD (gastroesophageal reflux disease)   COPD exacerbation   Asthma with acute exacerbation   Abdominal pain   OSA (obstructive sleep apnea)   Acute on chronic diastolic congestive heart failure  Follow-up recommendations Follow-up with PCP in 5-7 days Patient would like to  establish with cardiology in [place], follow-up with cardiology in one to 2 weeks Follow-up CBC, CMP in 1 week Patient to follow-up with outpatient gynecology for possible ovarian cyst/may need a pelvic ultrasound    Medication List    STOP taking these medications        flecainide 50 MG tablet  Commonly known as:  TAMBOCOR     losartan 50 MG tablet  Commonly known as:  COZAAR      TAKE these medications        aspirin 81 MG tablet  Take 81 mg by mouth every other day.     budesonide-formoterol 160-4.5 MCG/ACT inhaler  Commonly known as:  SYMBICORT  Inhale 1 puff into the lungs 2 (two) times daily.     dextromethorphan-guaiFENesin 30-600 MG per 12 hr tablet  Commonly known as:  MUCINEX DM  Take 1 tablet by mouth 2 (two) times daily.     ferrous fumarate 325 (106 FE) MG Tabs tablet  Commonly known as:  HEMOCYTE - 106 mg FE  Take 1 tablet by mouth daily.     furosemide 20 MG tablet  Commonly known as:  LASIX  Take 3 tablets (60 mg total) by mouth 2 (two) times daily.     levalbuterol 1.25 MG/0.5ML nebulizer solution  Commonly known as:  XOPENEX  Take 1.25 mg by nebulization every 4 (four) hours as needed for wheezing or shortness of breath.     montelukast 10 MG tablet  Commonly known as:  SINGULAIR  Take 10 mg by  mouth at bedtime.     pantoprazole 40 MG tablet  Commonly known as:  PROTONIX  Take 40 mg by mouth 2 (two) times daily.     predniSONE 10 MG tablet  Commonly known as:  DELTASONE  - 4 tablets for 4 days  - 3 tablets for 4 days  - 2 tablets for 4 days  - 1 tablet for 4 days  - Half a tablet for 4 days then discontinue     tiotropium 18 MCG inhalation capsule  Commonly known as:  SPIRIVA  Place 18 mcg into inhaler and inhale daily.     traMADol 50 MG tablet  Commonly known as:  ULTRAM  Take 50 mg by mouth 2 (two) times daily as needed for moderate pain.        Discharge Condition: Stable   Disposition: 01-Home or Self Care   Consults: Cardiology  Significant Diagnostic Studies: Dg Chest 2 View  02/25/2015   CLINICAL DATA:  Shortness of breath, congestion.  EXAM: CHEST  2 VIEW  COMPARISON:  02/23/2015  FINDINGS: Pacer remains in place, unchanged. There is cardiomegaly with vascular congestion. No overt edema. No confluent opacities. Probable small right pleural effusion. No acute bony abnormality.  IMPRESSION: Cardiomegaly with vascular congestion.  Small right effusion.  Electronically Signed   By: Rolm Baptise M.D.   On: 02/25/2015 17:31   Dg Chest 2 View  02/23/2015   CLINICAL DATA:  Shortness of breath, history of atrial flutter  EXAM: CHEST  2 VIEW  COMPARISON:  07/27/2014  FINDINGS: There is mild bilateral interstitial thickening. There is no pleural effusion or pneumothorax. There is a dual lead cardiac pacer. There is stable cardiomegaly.  The osseous structures are unremarkable.  IMPRESSION: Cardiomegaly with mild pulmonary vascular congestion.   Electronically Signed   By: Kathreen Devoid   On: 02/23/2015 19:36   Ct Abdomen Pelvis W Contrast  02/23/2015   CLINICAL DATA:  Lower abdomen pain for 2-3 weeks with nausea and diarrhea  EXAM: CT ABDOMEN AND PELVIS WITH CONTRAST  TECHNIQUE: Multidetector CT imaging of the abdomen and pelvis was performed using the standard  protocol following bolus administration of intravenous contrast.  CONTRAST:  48m OMNIPAQUE IOHEXOL 300 MG/ML SOLN, 1065mOMNIPAQUE IOHEXOL 300 MG/ML SOLN  COMPARISON:  None.  FINDINGS: There is diffuse fatty infiltration of liver. No focal liver lesion is identified. The spleen, pancreas, gallbladder, adrenal glands are normal. There are simple cysts in both kidneys, largest in the posterior midpole right kidney measuring 2.6 x 3.7 cm. There is no hydronephrosis bilaterally. There is atherosclerosis of the abdominal aorta without aneurysmal dilatation. There is no abdominal lymphadenopathy. There is venous phleboliths anterior to the mid to distal right ureter.  There is no small bowel obstruction. There is a small hiatal hernia. There is midline anterior herniation of mesenteric fat and colon in the pelvis without evidence of bowel incarceration or obstruction. There is subcutaneous fat stranding of the anterior lower pelvis.  Images of the pelvis demonstrate partial decompressed bladder. There is question enlargement of the right ovary measuring 3.3 x 7.3 cm. There is consolidation of the posterior right lower lobe. Degenerative joint changes of the spine are noted.  IMPRESSION: No acute abnormality is identified.  There is midline anterior herniation mesenteric fat and colon in the pelvis without evidence of bowel incarceration or obstruction.  Question enlargement of right ovary for patient age. Recommend further evaluation with pelvic ultrasound on outpatient basis.   Electronically Signed   By: WeAbelardo Diesel.D.   On: 02/23/2015 22:08   2-D echo   Microbiology: Recent Results (from the past 240 hour(s))  Respiratory virus panel     Status: None   Collection Time: 02/24/15  5:54 AM  Result Value Ref Range Status   Source - RVPAN NASAL SWAB  Corrected   Respiratory Syncytial Virus A Negative Negative Final   Respiratory Syncytial Virus B Negative Negative Final   Influenza A Negative Negative  Final   Influenza B Negative Negative Final   Parainfluenza 1 Negative Negative Final   Parainfluenza 2 Negative Negative Final   Parainfluenza 3 Negative Negative Final   Metapneumovirus Negative Negative Final   Rhinovirus Negative Negative Final   Adenovirus Negative Negative Final    Comment: (NOTE) Performed At: [facility] [id number] RompD PhME:[phone] Culture, blood (routine x 2) Call MD if unable to obtain prior to antibiotics being given     Status: None   Collection Time: 02/24/15  7:15 AM  Result Value Ref Range Status   Specimen Description BLOOD RIGHT ARM  Final   Special Requests BOTTLES DRAWN AEROBIC ONLY 6CBertrandFinal   Culture   Final    NO GROWTH 5  DAYS Performed at [facility]    Report Status 03/02/2015 FINAL  Final  Culture, blood (routine x 2) Call MD if unable to obtain prior to antibiotics being given     Status: None   Collection Time: 02/24/15  7:53 AM  Result Value Ref Range Status   Specimen Description BLOOD RIGHT ANTECUBITAL  Final   Special Requests BOTTLES DRAWN AEROBIC ONLY 5CC  Final   Culture   Final    NO GROWTH 5 DAYS Performed at [facility]    Report Status 03/02/2015 FINAL  Final  Urine culture     Status: None   Collection Time: 02/24/15  5:13 PM  Result Value Ref Range Status   Specimen Description URINE, RANDOM  Final   Special Requests NONE  Final   Colony Count NO GROWTH Performed at [facility]   Final   Culture NO GROWTH Performed at [facility]   Final   Report Status 02/26/2015 FINAL  Final     Labs: Results for orders placed or performed during the hospital encounter of 02/23/15 (from the past 48 hour(s))  Glucose, capillary     Status: Abnormal   Collection Time: 03/02/15  5:19 PM  Result Value Ref Range   Glucose-Capillary 443 (H) 70 - 99 mg/dL  Glucose, capillary     Status: Abnormal   Collection Time: 03/02/15  9:11 PM   Result Value Ref Range   Glucose-Capillary 483 (H) 70 - 99 mg/dL   Comment 1 Notify RN    Comment 2 Document in Chart   Glucose, capillary     Status: Abnormal   Collection Time: 03/03/15  5:13 AM  Result Value Ref Range   Glucose-Capillary 256 (H) 70 - 99 mg/dL  Glucose, capillary     Status: Abnormal   Collection Time: 03/03/15  7:50 AM  Result Value Ref Range   Glucose-Capillary 309 (H) 70 - 99 mg/dL  Glucose, capillary     Status: Abnormal   Collection Time: 03/03/15 11:45 AM  Result Value Ref Range   Glucose-Capillary 353 (H) 70 - 99 mg/dL  Glucose, capillary     Status: Abnormal   Collection Time: 03/03/15  5:34 PM  Result Value Ref Range   Glucose-Capillary 222 (H) 70 - 99 mg/dL  Glucose, capillary     Status: Abnormal   Collection Time: 03/03/15  8:50 PM  Result Value Ref Range   Glucose-Capillary 303 (H) 70 - 99 mg/dL  Comprehensive metabolic panel     Status: Abnormal   Collection Time: 03/04/15  4:58 AM  Result Value Ref Range   Sodium 138 135 - 145 mmol/L   Potassium 4.3 3.5 - 5.1 mmol/L   Chloride 88 (L) 96 - 112 mmol/L   CO2 39 (H) 19 - 32 mmol/L   Glucose, Bld 266 (H) 70 - 99 mg/dL   BUN 44 (H) 6 - 23 mg/dL   Creatinine, Ser 1.29 (H) 0.50 - 1.10 mg/dL   Calcium 9.0 8.4 - 10.5 mg/dL   Total Protein 5.5 (L) 6.0 - 8.3 g/dL   Albumin 3.3 (L) 3.5 - 5.2 g/dL   AST 15 0 - 37 U/L   ALT 20 0 - 35 U/L   Alkaline Phosphatase 51 39 - 117 U/L   Total Bilirubin 0.6 0.3 - 1.2 mg/dL   GFR calc non Af Amer 43 (L) >90 mL/min   GFR calc Af Amer 50 (L) >90 mL/min    Comment: (NOTE) The  eGFR has been calculated using the CKD EPI equation. This calculation has not been validated in all clinical situations. eGFR's persistently <90 mL/min signify possible Chronic Kidney Disease.    Anion gap 11 5 - 15  Glucose, capillary     Status: Abnormal   Collection Time: 03/04/15  7:57 AM  Result Value Ref Range   Glucose-Capillary 225 (H) 70 - 99 mg/dL  Comprehensive metabolic  panel     Status: Abnormal   Collection Time: 03/04/15  7:58 AM  Result Value Ref Range   Sodium 138 135 - 145 mmol/L   Potassium 4.2 3.5 - 5.1 mmol/L   Chloride 89 (L) 96 - 112 mmol/L   CO2 38 (H) 19 - 32 mmol/L   Glucose, Bld 221 (H) 70 - 99 mg/dL   BUN 43 (H) 6 - 23 mg/dL   Creatinine, Ser 1.23 (H) 0.50 - 1.10 mg/dL   Calcium 8.8 8.4 - 10.5 mg/dL   Total Protein 5.6 (L) 6.0 - 8.3 g/dL   Albumin 3.2 (L) 3.5 - 5.2 g/dL   AST 17 0 - 37 U/L   ALT 20 0 - 35 U/L   Alkaline Phosphatase 50 39 - 117 U/L   Total Bilirubin 0.6 0.3 - 1.2 mg/dL   GFR calc non Af Amer 46 (L) >90 mL/min   GFR calc Af Amer 53 (L) >90 mL/min    Comment: (NOTE) The eGFR has been calculated using the CKD EPI equation. This calculation has not been validated in all clinical situations. eGFR's persistently <90 mL/min signify possible Chronic Kidney Disease.    Anion gap 11 5 - 15     HPI :64 y.o. female with HTN, GERD, pacemaker placement, CHF, atrial flutter (not on anticoagulation due to history of GI bleeding), history of GI bleeding, history of stroke, asthma, COPD, on home oxygen 2 L, who presented with several days duration of progressive dyspnea with exertion and at rest, associated with wheezing, non productive cough. She also reported LLQ pain over the past week with some nausea but no vomiting.   In ED, patient was hemodynamically stable, CXR notable for pulmonary vascular congestion. CT abdomen/pelvis with non-incarcerated hernia, negative for other acute issues.  Hospital course is complicated with acute decompensated CHF with troponin elevation. Cardiology has seen the pt in consultation.    HOSPITAL COURSE:  Acute on chronic hypoxic respiratory failure / Acute COPD exacerbation with acute bronchitis  - Likely multifactorial and secondary to acute on chronic COPD with bronchitis, oxygen dependent on 2 L at rest, 4 L with ambulation, acute on chronic diastolic CHF, OSA/OHS  Cardiology recommends  continuing IV Lasix.  - Continue Xopenex scheduled and as needed Continue prednisone taper Home oxygen dependent 2-4 L at home   Acute on chronic diastolic right sided CHF - 2 D ECHO with normal EF and systolic function but severe pulm HTN. Decompensated CHF with right side heart failure also likely due ot OHS. Initiated on lasix 60 mg IV BID per cardiology recommendation.Now becoming alkalotic. Switched to by mouth lasix , this will be continued at home at the same dose unless cardiology recommends further changes today - Weight down from 354 lbs to 335 lbs during this admission Creatinine stable    Elevated troponin - Likely demand ischemia from combination of COPD and CHF - Troponin level normalized    SIRS / Leukocytosis  - Criteria met on admission - Pt with T 99.1 F, RR 24 bpm, BP 104/52, elevated lactic acid  -  Levaquin started for presumptive bronchitis. - Leukocytosis likely due to steroids.   Dysphagia for solids: Would recommend outpatient EGD as she may have developed a stricture  Hypertension - Continue lasix 60 mg PO BID  Acute renal failure - Secondary to Lasix - Renal function seems to be stable on current Lasix dose.  Atrial Fibrillation -Currently A. Fib w/ V pacing -CHA2DS2VASC Score: 5 -Pacemaker interrogation on 3/28 showed atrial fibrillation ongoing since 07/2014 and V-pacing >90%. Flecainide d/c on 3/26. - several ?NSVT episodes noted, see strips - True PVCs are very wide and different from rapid HR noted - device is programmed to notice > 8 bts if HR > 160 bpm, may just be rapid atrial fib or atrial tach -Not a candidate for anticoagulation due to history of GI bleed Patient would like to establish with cardiology in [place] post permanent pacemaker - Telemetry showed underlying atrial fibrillation. PVC's noted - Cardio interrogated pacemaker 3/26 See above  Obesity, morbid - Body mass index is 46.81 - Counseled on  nutrition  GERD (gastroesophageal reflux disease) - continue Protonix   Abdominal pain - Resolved   Iron deficiency anemia  - No reports of bleeding - Hemoglobin stable   Hyperglycemia, likely steroid induced  Patient is vocal about the fact that she is not a diabetic She would not like to continue with any insulin at home She feels that once the steroids are tapered off her hyperglycemia will be resolved - 6.5 Patient states that she has a glucometer at home   Discharge Exam:   Blood pressure 116/56, pulse 71, temperature 98 F (36.7 C), temperature source Oral, resp. rate 18, height 6' 1"  (1.854 m), weight 152.2 kg (335 lb 8.6 oz), SpO2 90 %.   General: Pt is alert, follows commands appropriately, not in acute distress  Cardiovascular: irregular rhythm, S1/S2 (+)  Respiratory: Diminished breath sounds, wheezing and upper lung lobes  Abdomen: Obese, nontender, non distended, bowel sounds present  Extremities: +1 lower extremity pitting edema, pulses DP and PT palpable bilaterally  Neuro: Grossly nonfocal         Follow-up Information    Follow up with Marry Guan. Schedule an appointment as soon as possible for a visit in 3 days.   Specialty:  Family Medicine   Contact information:   [address] [address] [phone]       Follow up with Sherren Mocha, MD. Schedule an appointment as soon as possible for a visit in 1 week.   Specialty:  Cardiology   Contact information:   [address] [place] [address] [phone]       Signed: Reyne Dumas 03/04/2015, 11:55 AM

## 2015-03-06 MED ORDER — ALBUTEROL SULFATE HFA 108 (90 BASE) MCG/ACT IN AERS: 2.0000 | INHALATION_SPRAY | Freq: Four times a day (QID) | RESPIRATORY_TRACT | Status: DC | PRN

## 2015-12-13 ENCOUNTER — Emergency Department (HOSPITAL_BASED_OUTPATIENT_CLINIC_OR_DEPARTMENT_OTHER)
Admission: EM | Admit: 2015-12-13 | Discharge: 2015-12-13 | Disposition: A | Discharge status: Other Acute Inpatient Hospital | Payer: Medicare Other | Attending: Emergency Medicine | Admitting: Emergency Medicine

## 2015-12-13 ENCOUNTER — Emergency Department (HOSPITAL_BASED_OUTPATIENT_CLINIC_OR_DEPARTMENT_OTHER): Payer: Medicare Other

## 2015-12-13 ENCOUNTER — Encounter (HOSPITAL_BASED_OUTPATIENT_CLINIC_OR_DEPARTMENT_OTHER): Payer: Self-pay | Admitting: *Deleted

## 2015-12-13 DIAGNOSIS — J45909 Unspecified asthma, uncomplicated: Secondary | ICD-10-CM | POA: Insufficient documentation

## 2015-12-13 DIAGNOSIS — I10 Essential (primary) hypertension: Secondary | ICD-10-CM | POA: Insufficient documentation

## 2015-12-13 DIAGNOSIS — I4892 Unspecified atrial flutter: Secondary | ICD-10-CM | POA: Insufficient documentation

## 2015-12-13 DIAGNOSIS — H6021 Malignant otitis externa, right ear: Secondary | ICD-10-CM

## 2015-12-13 DIAGNOSIS — H7091 Unspecified mastoiditis, right ear: Secondary | ICD-10-CM | POA: Diagnosis not present

## 2015-12-13 DIAGNOSIS — Z794 Long term (current) use of insulin: Secondary | ICD-10-CM | POA: Insufficient documentation

## 2015-12-13 DIAGNOSIS — K219 Gastro-esophageal reflux disease without esophagitis: Secondary | ICD-10-CM | POA: Diagnosis not present

## 2015-12-13 DIAGNOSIS — E669 Obesity, unspecified: Secondary | ICD-10-CM | POA: Insufficient documentation

## 2015-12-13 DIAGNOSIS — Z79899 Other long term (current) drug therapy: Secondary | ICD-10-CM | POA: Insufficient documentation

## 2015-12-13 DIAGNOSIS — Z7951 Long term (current) use of inhaled steroids: Secondary | ICD-10-CM | POA: Diagnosis not present

## 2015-12-13 DIAGNOSIS — Z7982 Long term (current) use of aspirin: Secondary | ICD-10-CM | POA: Diagnosis not present

## 2015-12-13 DIAGNOSIS — R51 Headache: Secondary | ICD-10-CM | POA: Diagnosis not present

## 2015-12-13 DIAGNOSIS — Z8673 Personal history of transient ischemic attack (TIA), and cerebral infarction without residual deficits: Secondary | ICD-10-CM | POA: Insufficient documentation

## 2015-12-13 DIAGNOSIS — I509 Heart failure, unspecified: Secondary | ICD-10-CM | POA: Diagnosis not present

## 2015-12-13 DIAGNOSIS — Z87891 Personal history of nicotine dependence: Secondary | ICD-10-CM | POA: Diagnosis not present

## 2015-12-13 DIAGNOSIS — R509 Fever, unspecified: Secondary | ICD-10-CM | POA: Diagnosis present

## 2015-12-13 DIAGNOSIS — H6091 Unspecified otitis externa, right ear: Secondary | ICD-10-CM

## 2015-12-13 LAB — COMPREHENSIVE METABOLIC PANEL
ALT: 9 U/L — ABNORMAL LOW (ref 14–54)
ANION GAP: 10 (ref 5–15)
AST: 19 U/L (ref 15–41)
Albumin: 3.9 g/dL (ref 3.5–5.0)
Alkaline Phosphatase: 50 U/L (ref 38–126)
BILIRUBIN TOTAL: 1.2 mg/dL (ref 0.3–1.2)
BUN: 44 mg/dL — ABNORMAL HIGH (ref 6–20)
CALCIUM: 8.4 mg/dL — AB (ref 8.9–10.3)
CO2: 30 mmol/L (ref 22–32)
Chloride: 94 mmol/L — ABNORMAL LOW (ref 101–111)
Creatinine, Ser: 2.43 mg/dL — ABNORMAL HIGH (ref 0.44–1.00)
GFR calc Af Amer: 23 mL/min — ABNORMAL LOW (ref >60)
GFR, EST NON AFRICAN AMERICAN: 20 mL/min — AB (ref >60)
Glucose, Bld: 139 mg/dL — ABNORMAL HIGH (ref 65–99)
POTASSIUM: 3.7 mmol/L (ref 3.5–5.1)
SODIUM: 134 mmol/L — AB (ref 135–145)
TOTAL PROTEIN: 8.2 g/dL — AB (ref 6.5–8.1)

## 2015-12-13 LAB — CBC WITH DIFFERENTIAL/PLATELET
BASOS PCT: 0 %
Basophils Absolute: 0 10*3/uL (ref 0.0–0.1)
EOS ABS: 0 10*3/uL (ref 0.0–0.7)
EOS PCT: 0 %
HCT: 34 % — ABNORMAL LOW (ref 36.0–46.0)
Hemoglobin: 10.2 g/dL — ABNORMAL LOW (ref 12.0–15.0)
Lymphocytes Relative: 6 %
Lymphs Abs: 0.7 10*3/uL (ref 0.7–4.0)
MCH: 26.2 pg (ref 26.0–34.0)
MCHC: 30 g/dL (ref 30.0–36.0)
MCV: 87.4 fL (ref 78.0–100.0)
MONO ABS: 1 10*3/uL (ref 0.1–1.0)
Monocytes Relative: 8 %
Neutro Abs: 11 10*3/uL — ABNORMAL HIGH (ref 1.7–7.7)
Neutrophils Relative %: 86 %
Platelets: 153 10*3/uL (ref 150–400)
RBC: 3.89 MIL/uL (ref 3.87–5.11)
RDW: 16.4 % — AB (ref 11.5–15.5)
WBC: 12.8 10*3/uL — AB (ref 4.0–10.5)

## 2015-12-13 LAB — SEDIMENTATION RATE: SED RATE: 90 mm/h — AB (ref 0–22)

## 2015-12-13 LAB — I-STAT CG4 LACTIC ACID, ED
LACTIC ACID, VENOUS: 1.31 mmol/L (ref 0.5–2.0)
LACTIC ACID, VENOUS: 0.8 mmol/L (ref 0.5–2.0)

## 2015-12-13 LAB — C-REACTIVE PROTEIN: CRP: 19.3 mg/dL — ABNORMAL HIGH (ref <1.0)

## 2015-12-13 LAB — URINALYSIS, ROUTINE W REFLEX MICROSCOPIC
Glucose, UA: NEGATIVE mg/dL
Hgb urine dipstick: NEGATIVE
Ketones, ur: NEGATIVE mg/dL
Leukocytes, UA: NEGATIVE
Nitrite: NEGATIVE
PROTEIN: 30 mg/dL — AB
Specific Gravity, Urine: 1.015 (ref 1.005–1.030)
pH: 5 (ref 5.0–8.0)

## 2015-12-13 LAB — URINE MICROSCOPIC-ADD ON: RBC / HPF: NONE SEEN RBC/hpf (ref 0–5)

## 2015-12-13 MED ORDER — DIPHENHYDRAMINE HCL 50 MG/ML IJ SOLN
25.0000 mg | Freq: Once | INTRAMUSCULAR | Status: AC
  Administered 2015-12-13: 25 mg via INTRAVENOUS
  Filled 2015-12-13: qty 1

## 2015-12-13 MED ORDER — ACETAMINOPHEN 325 MG PO TABS
650.0000 mg | ORAL_TABLET | Freq: Once | ORAL | Status: AC | PRN
  Administered 2015-12-13: 650 mg via ORAL
  Filled 2015-12-13: qty 2

## 2015-12-13 MED ORDER — SODIUM CHLORIDE 0.9 % IV BOLUS (SEPSIS)
1000.0000 mL | Freq: Once | INTRAVENOUS | Status: AC
  Administered 2015-12-13: 1000 mL via INTRAVENOUS

## 2015-12-13 MED ORDER — PIPERACILLIN-TAZOBACTAM 3.375 G IVPB 30 MIN
3.3750 g | Freq: Once | INTRAVENOUS | Status: AC
  Administered 2015-12-13: 3.375 g via INTRAVENOUS
  Filled 2015-12-13 (×2): qty 50

## 2015-12-13 MED ORDER — METOCLOPRAMIDE HCL 5 MG/ML IJ SOLN
5.0000 mg | Freq: Once | INTRAMUSCULAR | Status: AC
  Administered 2015-12-13: 5 mg via INTRAVENOUS
  Filled 2015-12-13: qty 2

## 2015-12-13 NOTE — ED Notes (Signed)
Called [facility] @ [phone]

## 2015-12-13 NOTE — ED Notes (Signed)
Asher MuirJamie, charge RN and [place], RN made aware of pt's status

## 2015-12-13 NOTE — ED Notes (Signed)
Pt reports right ear pain since Friday. States she "felt 2 bumps and messed with them"- since then pain has been worse

## 2015-12-13 NOTE — ED Notes (Signed)
PT[facility] at bedside- [facility]. All questions answered. Dtg took all personal items.

## 2015-12-13 NOTE — ED Provider Notes (Signed)
CSN: [id number]     Arrival date & time 12/13/15  1421 History   First MD Initiated Contact with Patient 12/13/15 1503     Chief Complaint  Patient presents with  . Otalgia  . Fever     (Consider location/radiation/quality/duration/timing/severity/associated sxs/prior Treatment) Patient is a 65 y.o. female presenting with ear pain and fever. The history is provided by the patient.  Otalgia Location:  Right Behind ear:  Redness Quality:  Aching, sore and shooting Severity:  Moderate Onset quality:  Gradual Timing:  Constant Progression:  Worsening Chronicity:  New Context: not foreign body in ear and no water in ear   Relieved by:  Nothing Worsened by:  Nothing tried Ineffective treatments:  None tried Associated symptoms: ear discharge, fever and headaches   Associated symptoms: no congestion, no rhinorrhea, no sore throat, no tinnitus and no vomiting   Fever Associated symptoms: ear pain and headaches   Associated symptoms: no chest pain, no chills, no congestion, no dysuria, no myalgias, no nausea, no rhinorrhea, no sore throat and no vomiting     65 yo F with a chief complaint of right ear pain. The started couple days ago. Patient started having fevers and worsening pain and swelling to that area. Fevers high as 102 at home. Patient denies any water exposure denies any foreign body to the ear. Denies prior infection to the area. Denies history of diabetes. Denies vascular disease. Denies nausea vomiting cough congestion abdominal pain or dysuria.  Past Medical History  Diagnosis Date  . Hypertension   . Asthma   . CHF (congestive heart failure) ([place])   . Atrial flutter ([place])   . Obesity   . Stroke ([place])   . GERD (gastroesophageal reflux disease)   . GIB (gastrointestinal bleeding)   . Bradycardia     s/p Biotronic dual chamber device   Past Surgical History  Procedure Laterality Date  . Appendectomy    . Bladder repair    . Ovary surgery    . Pacemaker insertion   2014    Biotronic   Family History  Problem Relation Age of Onset  . Diabetes Mother   . Hypertension Mother   . Heart attack Mother   . Heart attack Father   . Diabetes Brother    Social History  Substance Use Topics  . Smoking status: Former Research scientist (life sciences)  . Smokeless tobacco: Never Used  . Alcohol Use: No   OB History    No data available     Review of Systems  Constitutional: Positive for fever. Negative for chills.  HENT: Positive for ear discharge and ear pain. Negative for congestion, rhinorrhea, sore throat and tinnitus.   Eyes: Negative for redness and visual disturbance.  Respiratory: Negative for shortness of breath and wheezing.   Cardiovascular: Negative for chest pain and palpitations.  Gastrointestinal: Negative for nausea and vomiting.  Genitourinary: Negative for dysuria and urgency.  Musculoskeletal: Negative for myalgias and arthralgias.  Skin: Negative for pallor and wound.  Neurological: Positive for headaches. Negative for dizziness.      Allergies  Review of patient's allergies indicates no known allergies.  Home Medications   Prior to Admission medications   Medication Sig Start Date End Date Taking? Authorizing Provider  albuterol (PROVENTIL HFA;VENTOLIN HFA) 108 (90 BASE) MCG/ACT inhaler Inhale 2 puffs into the lungs every 6 (six) hours as needed for wheezing or shortness of breath. 03/06/15  Yes Reyne Dumas, MD  aspirin 81 MG tablet Take 81 mg by mouth  every other day.    Yes Historical Provider, MD  budesonide-formoterol (SYMBICORT) 160-4.5 MCG/ACT inhaler Inhale 1 puff into the lungs 2 (two) times daily.    Yes Historical Provider, MD  dextromethorphan-guaiFENesin (MUCINEX DM) 30-600 MG per 12 hr tablet Take 1 tablet by mouth 2 (two) times daily. 03/04/15  Yes Reyne Dumas, MD  ferrous fumarate (HEMOCYTE - 106 MG FE) 325 (106 FE) MG TABS tablet Take 1 tablet by mouth daily.   Yes Historical Provider, MD  furosemide (LASIX) 20 MG tablet Take 3  tablets (60 mg total) by mouth 2 (two) times daily. 03/04/15  Yes Reyne Dumas, MD  HYDROcodone-acetaminophen (NORCO/VICODIN) 5-325 MG tablet Take 1 tablet by mouth every 6 (six) hours as needed for moderate pain.   Yes Historical Provider, MD  LORazepam (ATIVAN) 0.5 MG tablet Take 0.5 mg by mouth every 8 (eight) hours.   Yes Historical Provider, MD  montelukast (SINGULAIR) 10 MG tablet Take 10 mg by mouth at bedtime.   Yes Historical Provider, MD  pantoprazole (PROTONIX) 40 MG tablet Take 40 mg by mouth 2 (two) times daily.    Yes Historical Provider, MD  tiotropium (SPIRIVA) 18 MCG inhalation capsule Place 18 mcg into inhaler and inhale daily.   Yes Historical Provider, MD  traMADol (ULTRAM) 50 MG tablet Take 50 mg by mouth 2 (two) times daily as needed for moderate pain.   Yes Historical Provider, MD  insulin glargine (LANTUS) 100 UNIT/ML injection Inject 0.25 mLs (25 Units total) into the skin daily. 03/04/15   Reyne Dumas, MD  predniSONE (DELTASONE) 10 MG tablet 4 tablets for 4 days 3 tablets for 4 days 2 tablets for 4 days 1 tablet for 4 days Half a tablet for 4 days then discontinue 03/04/15   Reyne Dumas, MD   BP 112/62 mmHg  Pulse 81  Temp(Src) 99 F (37.2 C) (Oral)  Resp 30  Ht _0  (1.854 m)  Wt 294 lb (133.358 kg)  BMI 38.80 kg/m2  SpO2 100% Physical Exam  Constitutional: She is oriented to person, place, and time. She appears well-developed and well-nourished. No distress.  HENT:  Head: Normocephalic and atraumatic.  Marked swelling to the patient as well as the external auditory canal with some purulent discharge. TM is normal. Tenderness to palpation about the mastoid with some erythema. No noted intraoral trauma or pain.  Eyes: EOM are normal. Pupils are equal, round, and reactive to light.  Neck: Normal range of motion. Neck supple.  Cardiovascular: Normal rate and regular rhythm.  Exam reveals no gallop and no friction rub.   No murmur heard. Pulmonary/Chest: Effort  normal. She has no wheezes. She has no rales.  Abdominal: Soft. She exhibits no distension. There is no tenderness. There is no rebound and no guarding.  Musculoskeletal: She exhibits no edema or tenderness.  Neurological: She is alert and oriented to person, place, and time.  Skin: Skin is warm and dry. She is not diaphoretic.  Psychiatric: She has a normal mood and affect. Her behavior is normal.  Nursing note and vitals reviewed.   ED Course  Procedures (including critical care time) Labs Review Labs Reviewed  COMPREHENSIVE METABOLIC PANEL - Abnormal; Notable for the following:    Sodium 134 (*)    Chloride 94 (*)    Glucose, Bld 139 (*)    BUN 44 (*)    Creatinine, Ser 2.43 (*)    Calcium 8.4 (*)    Total Protein 8.2 (*)    ALT  9 (*)    GFR calc non Af Amer 20 (*)    GFR calc Af Amer 23 (*)    All other components within normal limits  URINALYSIS, ROUTINE W REFLEX MICROSCOPIC (NOT AT [facility]) - Abnormal; Notable for the following:    Color, Urine AMBER (*)    APPearance CLOUDY (*)    Bilirubin Urine SMALL (*)    Protein, ur 30 (*)    All other components within normal limits  CBC WITH DIFFERENTIAL/PLATELET - Abnormal; Notable for the following:    WBC 12.8 (*)    Hemoglobin 10.2 (*)    HCT 34.0 (*)    RDW 16.4 (*)    Neutro Abs 11.0 (*)    All other components within normal limits  SEDIMENTATION RATE - Abnormal; Notable for the following:    Sed Rate 90 (*)    All other components within normal limits  URINE MICROSCOPIC-ADD ON - Abnormal; Notable for the following:    Squamous Epithelial / LPF 0-5 (*)    Bacteria, UA MANY (*)    Casts HYALINE CASTS (*)    All other components within normal limits  CULTURE, BLOOD (ROUTINE X 2)  CULTURE, BLOOD (ROUTINE X 2)  URINE CULTURE  C-REACTIVE PROTEIN  I-STAT CG4 LACTIC ACID, ED  I-STAT CG4 LACTIC ACID, ED    Imaging Review Dg Chest 2 View  12/13/2015  CLINICAL DATA:  Fever for 3 days.  Initial encounter. EXAM: CHEST  2  VIEW COMPARISON:  PA and lateral chest 02/25/2015 07/27/2014. CT chest 07/27/2014. FINDINGS: There is cardiomegaly without edema. The lungs are clear. There appears to be a trace amount of pleural fluid on the right. Aortic atherosclerosis is noted. Pacing device is in place. IMPRESSION: Cardiomegaly without edema. Trace right pleural effusion. Electronically Signed   By: Inge Rise M.D.   On: 12/13/2015 15:59   Ct Temporal Bones W/o Cm  12/13/2015  CLINICAL DATA:  Right otitis externa. Pain for 3 days involving the right ear, temple, and angle of the mandible. EXAM: CT TEMPORAL BONES WITHOUT CONTRAST TECHNIQUE: Axial and coronal plane CT imaging of the petrous temporal bones was performed with thin-collimation image reconstruction. No intravenous contrast was administered. Multiplanar CT image reconstructions were also generated. COMPARISON:  None. FINDINGS: RIGHT: There is a moderate soft tissue thickening involving the external auditory canal, greatest superiorly where there is extension medially to the tympanic membrane with slight blunting of the scutum compared to the contralateral side which may indicate early osseous. No erosive osseous changes are identified more laterally in the external auditory canal. The ossicles appear intact. The tympanic cavity is clear. The internal auditory canal, cochlea, vestibule, and semicircular canals are unremarkable. The mastoid air cells are clear. LEFT: The external auditory canal and tympanic membrane are unremarkable. The ossicles appear intact. The tympanic cavity is clear. The internal auditory canal, cochlea, vestibule, and semicircular canals are unremarkable. The mastoid air cells are clear. There is mild bilateral ethmoid air cell mucosal thickening. Calcifications/ossification is noted projecting superomedially from the posterior margin of the left petrous temporal bone and which may be associated with the tentorium. IMPRESSION: Moderate soft tissue  thickening in the right external auditory canal with possible slight erosion of the scutum. This is compatible with the provided history of otitis externa with possible early bone infection, however other causes of EAC masses such as cholesteatoma and squamous cell carcinoma (or other malignancy) are not excluded. Electronically Signed   By: Seymour Bars.D.  On: 12/13/2015 16:31   I have personally reviewed and evaluated these images and lab results as part of my medical decision-making.   EKG Interpretation None      MDM   Final diagnoses:  Right otitis externa  Malignant otitis externa of right ear  Mastoiditis, right    65 yo F with a chief complaint of right ear swelling. Exam consistent with malignant otitis externa. Patient having pain to the mastoid, CT scan performed without contrast due to acute kidney injury. Patient was found to have possible bone involvement. Also with elevated ESR. Patient was given Zosyn. Due to patient preference will transfer to [facility]. After discussion with critical care the hospitalist in the ED physician will do an EGD ED transfer and have the patient evaluated by ENT on arrival.  CRITICAL CARE Performed by: Cecilio Asper   Total critical care time: 40 minutes  Critical care time was exclusive of separately billable procedures and treating other patients.  Critical care was necessary to treat or prevent imminent or life-threatening deterioration.  Critical care was time spent personally by me on the following activities: development of treatment plan with patient and/or surrogate as well as nursing, discussions with consultants, evaluation of patient's response to treatment, examination of patient, obtaining history from patient or surrogate, ordering and performing treatments and interventions, ordering and review of laboratory studies, ordering and review of radiographic studies, pulse oximetry and re-evaluation of patient's  condition.   The patients results and plan were reviewed and discussed.   Any x-rays performed were independently reviewed by myself.   Differential diagnosis were considered with the presenting HPI.  Medications  acetaminophen (TYLENOL) tablet 650 mg (650 mg Oral Given 12/13/15 1456)  sodium chloride 0.9 % bolus 1,000 mL (0 mLs Intravenous Stopped 12/13/15 1630)  piperacillin-tazobactam (ZOSYN) IVPB 3.375 g (0 g Intravenous Stopped 12/13/15 1814)  metoCLOPramide (REGLAN) injection 5 mg (5 mg Intravenous Given 12/13/15 1728)  diphenhydrAMINE (BENADRYL) injection 25 mg (25 mg Intravenous Given 12/13/15 1728)    Filed Vitals:   12/13/15 1730 12/13/15 1733 12/13/15 1800 12/13/15 1814  BP:  103/65  112/62  Pulse: 83 82 75 81  Temp:    99 F (37.2 C)  TempSrc:    Oral  Resp: _0 Height:      Weight:      SpO2: 99% 99% 100% 100%    Final diagnoses:  Right otitis externa  Malignant otitis externa of right ear  Mastoiditis, right    Admission/ Transfer was discussed with the admitting physician, patient and/or family and they are comfortable with the plan.      Deno Etienne, DO 12/13/15 [phone]

## 2015-12-13 NOTE — ED Notes (Signed)
MD at bedside. 

## 2015-12-13 NOTE — ED Notes (Signed)
[facility]

## 2015-12-14 LAB — URINE CULTURE: CULTURE: NO GROWTH

## 2015-12-18 LAB — CULTURE, BLOOD (ROUTINE X 2)
Culture: NO GROWTH
Culture: NO GROWTH

## 2016-02-20 ENCOUNTER — Emergency Department (HOSPITAL_BASED_OUTPATIENT_CLINIC_OR_DEPARTMENT_OTHER)
Admission: EM | Admit: 2016-02-20 | Discharge: 2016-02-20 | Disposition: A | Discharge status: Home / Self Care | Payer: Medicare Other | Attending: Emergency Medicine | Admitting: Emergency Medicine

## 2016-02-20 ENCOUNTER — Encounter (HOSPITAL_BASED_OUTPATIENT_CLINIC_OR_DEPARTMENT_OTHER): Payer: Self-pay | Admitting: Emergency Medicine

## 2016-02-20 ENCOUNTER — Emergency Department (HOSPITAL_BASED_OUTPATIENT_CLINIC_OR_DEPARTMENT_OTHER): Payer: Medicare Other

## 2016-02-20 DIAGNOSIS — Z7982 Long term (current) use of aspirin: Secondary | ICD-10-CM | POA: Insufficient documentation

## 2016-02-20 DIAGNOSIS — I4892 Unspecified atrial flutter: Secondary | ICD-10-CM | POA: Diagnosis not present

## 2016-02-20 DIAGNOSIS — Z7951 Long term (current) use of inhaled steroids: Secondary | ICD-10-CM | POA: Insufficient documentation

## 2016-02-20 DIAGNOSIS — M79671 Pain in right foot: Secondary | ICD-10-CM | POA: Diagnosis present

## 2016-02-20 DIAGNOSIS — E669 Obesity, unspecified: Secondary | ICD-10-CM | POA: Insufficient documentation

## 2016-02-20 DIAGNOSIS — J45909 Unspecified asthma, uncomplicated: Secondary | ICD-10-CM | POA: Insufficient documentation

## 2016-02-20 DIAGNOSIS — Z794 Long term (current) use of insulin: Secondary | ICD-10-CM | POA: Insufficient documentation

## 2016-02-20 DIAGNOSIS — Z87891 Personal history of nicotine dependence: Secondary | ICD-10-CM | POA: Insufficient documentation

## 2016-02-20 DIAGNOSIS — I509 Heart failure, unspecified: Secondary | ICD-10-CM | POA: Insufficient documentation

## 2016-02-20 DIAGNOSIS — Z79899 Other long term (current) drug therapy: Secondary | ICD-10-CM | POA: Insufficient documentation

## 2016-02-20 DIAGNOSIS — Z950 Presence of cardiac pacemaker: Secondary | ICD-10-CM | POA: Diagnosis not present

## 2016-02-20 DIAGNOSIS — K219 Gastro-esophageal reflux disease without esophagitis: Secondary | ICD-10-CM | POA: Diagnosis not present

## 2016-02-20 DIAGNOSIS — M79604 Pain in right leg: Secondary | ICD-10-CM | POA: Insufficient documentation

## 2016-02-20 DIAGNOSIS — M25471 Effusion, right ankle: Secondary | ICD-10-CM | POA: Insufficient documentation

## 2016-02-20 DIAGNOSIS — Z8673 Personal history of transient ischemic attack (TIA), and cerebral infarction without residual deficits: Secondary | ICD-10-CM | POA: Insufficient documentation

## 2016-02-20 DIAGNOSIS — I10 Essential (primary) hypertension: Secondary | ICD-10-CM | POA: Diagnosis not present

## 2016-02-20 DIAGNOSIS — R52 Pain, unspecified: Secondary | ICD-10-CM

## 2016-02-20 MED ORDER — HYDROCODONE-ACETAMINOPHEN 5-325 MG PO TABS
1.0000 | ORAL_TABLET | Freq: Once | ORAL | Status: AC
  Administered 2016-02-20: 1 via ORAL
  Filled 2016-02-20: qty 1

## 2016-02-20 MED ORDER — HYDROCODONE-ACETAMINOPHEN 5-325 MG PO TABS: 1.0000 | ORAL_TABLET | Freq: Four times a day (QID) | ORAL | Status: DC | PRN

## 2016-02-20 MED ORDER — TRAMADOL HCL 50 MG PO TABS: 50.0000 mg | ORAL_TABLET | Freq: Two times a day (BID) | ORAL | Status: DC | PRN

## 2016-02-20 MED ORDER — METAXALONE 800 MG PO TABS: 800.0000 mg | ORAL_TABLET | Freq: Three times a day (TID) | ORAL | Status: AC | PRN

## 2016-02-20 NOTE — ED Notes (Signed)
Pt in c/o R foot pain onset 2 days ago and not getting better after she wore a certain pair of shoes. Pt on home O2, NAD.

## 2016-02-20 NOTE — ED Notes (Signed)
Rad staff in to schedule dopper US study tomorrow at 1400.

## 2016-02-20 NOTE — ED Notes (Signed)
Back from xray, no changes, alert, NAD, calm, interactive.  

## 2016-02-20 NOTE — Discharge Instructions (Signed)
[facility] to have a doppler study done on your right leg to ensure no signs of DVT (deep venous thrombosis).  Take pain medication as prescribed.   Pain Without a Known Cause WHAT IS PAIN WITHOUT A KNOWN CAUSE? Pain can occur in any part of the body and can range from mild to severe. Sometimes no cause can be found for why you are having pain. Some types of pain that can occur without a known cause include:   Headache.  Back pain.  Abdominal pain.  Neck pain. HOW IS PAIN WITHOUT A KNOWN CAUSE DIAGNOSED?  Your health care provider will try to find the cause of your pain. This may include:  Physical exam.  Medical history.  Blood tests.  Urine tests.  X-rays. If no cause is found, your health care provider may diagnose you with pain without a known cause.  IS THERE TREATMENT FOR PAIN WITHOUT A CAUSE?  Treatment depends on the kind of pain you have. Your health care provider may prescribe medicines to help relieve your pain.  WHAT CAN I DO AT HOME FOR MY PAIN?   Take medicines only as directed by your health care provider.  Stop any activities that cause pain. During periods of severe pain, bed rest may help.  Try to reduce your stress with activities such as yoga or meditation. Talk to your health care provider for other stress-reducing activity recommendations.  Exercise regularly, if approved by your health care provider.  Eat a healthy diet that includes fruits and vegetables. This may improve pain. Talk to your health care provider if you have any questions about your diet. WHAT IF MY PAIN DOES NOT GET BETTER?  If you have a painful condition and no reason can be found for the pain or the pain gets worse, it is important to follow up with your health care provider. It may be necessary to repeat tests and look further for a possible cause.    This information is not intended to replace advice given to you by your health care provider. Make sure  you discuss any questions you have with your health care provider.   Document Released: 08/16/2001 Document Revised: 12/12/2014 Document Reviewed: 04/08/2014 Elsevier Interactive Patient Education [employer]2016 Elsevier Inc.

## 2016-02-20 NOTE — ED Notes (Signed)
C/o R ankle pain, no redness, swelling and warm to touch, CMS intact, ROM decreased d/t pain, (denies: h/o injury, gout or DM; denies: fever or other sx). "Pain down to toes and up to mid shin".

## 2016-02-20 NOTE — ED Provider Notes (Signed)
CSN: [id number]     Arrival date & time 02/20/16  1948 History   First MD Initiated Contact with Patient 02/20/16 2025     Chief Complaint  Patient presents with  . Foot Pain     (Consider location/radiation/quality/duration/timing/severity/associated sxs/prior Treatment) HPI   65 year old obese female with history of a flutter, prior stroke, bradycardia status post pacemaker presenting with complaint of foot pain. Patient report for the past 2 days she has had persistent sharp achy 9 out of 10 pain to her right foot extending towards the right calf worsening with movement or ambulation. She has also noticed swelling to the right ankle. Prolonged standing worsen her pain. No specific treatment tried, no fever or numbness, and no recent injury. She has not changed any new shoes. She reported having similar pain to her left foot in January when she was hospitalized with a bad ear infection. That pain lasted for weekend subsequently resolved. No DVT. She was supposed to be on warfarin for her atrial flutter but took herself off because she has history of GI bleed in the past. She denies any history of gout. No complaint of chest pain, shortness of breath, headache, lightheadedness or dizziness. Patient is on home O2.  Past Medical History  Diagnosis Date  . Hypertension   . Asthma   . CHF (congestive heart failure) ([facility])   . Atrial flutter ([facility])   . Obesity   . Stroke ([facility])   . GERD (gastroesophageal reflux disease)   . GIB (gastrointestinal bleeding)   . Bradycardia     s/p Biotronic dual chamber device   Past Surgical History  Procedure Laterality Date  . Appendectomy    . Bladder repair    . Ovary surgery    . Pacemaker insertion  2014    Biotronic   Family History  Problem Relation Age of Onset  . Diabetes Mother   . Hypertension Mother   . Heart attack Mother   . Heart attack Father   . Diabetes Brother    Social History  Substance Use Topics  . Smoking status: Former  Games developer  . Smokeless tobacco: Never Used  . Alcohol Use: No   OB History    No data available     Review of Systems  All other systems reviewed and are negative.     Allergies  Review of patient's allergies indicates no known allergies.  Home Medications   Prior to Admission medications   Medication Sig Start Date End Date Taking? Authorizing Provider  albuterol (PROVENTIL HFA;VENTOLIN HFA) 108 (90 BASE) MCG/ACT inhaler Inhale 2 puffs into the lungs every 6 (six) hours as needed for wheezing or shortness of breath. 03/06/15   Richarda Overlie, MD  aspirin 81 MG tablet Take 81 mg by mouth every other day.     Historical Provider, MD  budesonide-formoterol (SYMBICORT) 160-4.5 MCG/ACT inhaler Inhale 1 puff into the lungs 2 (two) times daily.     Historical Provider, MD  dextromethorphan-guaiFENesin (MUCINEX DM) 30-600 MG per 12 hr tablet Take 1 tablet by mouth 2 (two) times daily. 03/04/15   Richarda Overlie, MD  ferrous fumarate (HEMOCYTE - 106 MG FE) 325 (106 FE) MG TABS tablet Take 1 tablet by mouth daily.    Historical Provider, MD  furosemide (LASIX) 20 MG tablet Take 3 tablets (60 mg total) by mouth 2 (two) times daily. 03/04/15   Richarda Overlie, MD  HYDROcodone-acetaminophen (NORCO/VICODIN) 5-325 MG tablet Take 1 tablet by mouth every 6 (six) hours as  needed for moderate pain.    Historical Provider, MD  insulin glargine (LANTUS) 100 UNIT/ML injection Inject 0.25 mLs (25 Units total) into the skin daily. 03/04/15   Richarda OverlieNayana Abrol, MD  LORazepam (ATIVAN) 0.5 MG tablet Take 0.5 mg by mouth every 8 (eight) hours.    Historical Provider, MD  montelukast (SINGULAIR) 10 MG tablet Take 10 mg by mouth at bedtime.    Historical Provider, MD  pantoprazole (PROTONIX) 40 MG tablet Take 40 mg by mouth 2 (two) times daily.     Historical Provider, MD  predniSONE (DELTASONE) 10 MG tablet 4 tablets for 4 days 3 tablets for 4 days 2 tablets for 4 days 1 tablet for 4 days Half a tablet for 4 days then  discontinue 03/04/15   Richarda OverlieNayana Abrol, MD  tiotropium (SPIRIVA) 18 MCG inhalation capsule Place 18 mcg into inhaler and inhale daily.    Historical Provider, MD  traMADol (ULTRAM) 50 MG tablet Take 50 mg by mouth 2 (two) times daily as needed for moderate pain.    Historical Provider, MD   BP 138/66 mmHg  Pulse 75  Temp(Src) 98.6 F (37 C) (Oral)  Resp 22  Ht 6\' 1"  (1.854 m)  Wt 134.265 kg  BMI 39.06 kg/m2  SpO2 97% Physical Exam  Constitutional: She appears well-developed and well-nourished. No distress.  Obese African-American female nontoxic in appearance, wearing supplemental oxygenation.  HENT:  Head: Atraumatic.  Eyes: Conjunctivae are normal.  Neck: Neck supple.  Musculoskeletal: She exhibits tenderness (Right leg: Tenderness along right posterior calf, medial ankle and proximal anterior foot on palpation with mild edema to right ankle. Dorsalis pedis pulses palpable with brisk cap refill. Normal dorsi/plantarflexion.  ).  Neurological: She is alert.  Skin: No rash noted.  Psychiatric: She has a normal mood and affect.  Nursing note and vitals reviewed.   ED Course  Procedures (including critical care time) Labs Review Labs Reviewed - No data to display  Imaging Review Dg Ankle Complete Right  02/20/2016  CLINICAL DATA:  Generalized right ankle pain and swelling. Fever. Initial encounter. EXAM: RIGHT ANKLE - COMPLETE 3+ VIEW COMPARISON:  None. FINDINGS: There is no evidence of fracture or dislocation. The ankle mortise is intact; the interosseous space is within normal limits. No talar tilt or subluxation is seen. Plantar and posterior calcaneal spurs are seen. Pes planus is noted. An accessory ossicle is noted plantar to the anterior calcaneus. The joint spaces are preserved. Medial soft tissue swelling is noted. An ankle joint effusion is noted. IMPRESSION: 1. No evidence of fracture or dislocation. 2. Ankle joint effusion seen. 3. Pes planus noted. Electronically Signed   By:  Roanna RaiderJeffery  Chang M.D.   On: 02/20/2016 21:16   I have personally reviewed and evaluated these images and lab results as part of my medical decision-making.   EKG Interpretation None      MDM   Final diagnoses:  Right leg pain    BP 138/66 mmHg  Pulse 75  Temp(Src) 98.6 F (37 C) (Oral)  Resp 22  Ht 6\' 1"  (1.854 m)  Wt 134.265 kg  BMI 39.06 kg/m2  SpO2 97%   Patient presents with right leg pain. She does have moderate edema noted to her right ankle and proximal foot. She is neurovascularly intact. She does have history of atrial flutter but currently not on any blood thinning medication. Given her age and her comorbidity, DVT is a consideration, however low suspicion. However, DVT study is not available at this  time and patient also refused to take any anticoagulants for prophylaxis. She understands the risk of potential blood clot that can cause complication including PE and death. An x-ray of her right ankle was ordered, pain medication given.  Care discussed with Dr. Clayborne Dana.  9:37 PM X-ray of right ankle shows ankle joint effusion but no signs of fractures or dislocation. She does not have symptoms to suggest cellulitis or septic joint. I will order for a DVT study tomorrow, ASO provided for support, pain medication prescribed, and close follow-up recommended. Patient voiced understanding and agrees with plan. She is able to ambulate.     Fayrene Helper, PA-C 02/20/16 2148  Marily Memos, MD 02/24/16 [phone]

## 2016-02-21 ENCOUNTER — Ambulatory Visit (HOSPITAL_COMMUNITY): Payer: Medicare Other

## 2016-02-21 ENCOUNTER — Ambulatory Visit (HOSPITAL_BASED_OUTPATIENT_CLINIC_OR_DEPARTMENT_OTHER)
Admission: RE | Admit: 2016-02-21 | Discharge: 2016-02-21 | Disposition: A | Discharge status: Home / Self Care | Payer: Medicare Other | Source: Ambulatory Visit | Attending: Internal Medicine | Admitting: Internal Medicine

## 2016-02-21 DIAGNOSIS — M79604 Pain in right leg: Secondary | ICD-10-CM | POA: Insufficient documentation

## 2016-03-22 ENCOUNTER — Emergency Department (HOSPITAL_BASED_OUTPATIENT_CLINIC_OR_DEPARTMENT_OTHER)
Admission: EM | Admit: 2016-03-22 | Discharge: 2016-03-22 | Disposition: A | Discharge status: Home / Self Care | Payer: Medicare Other | Attending: Emergency Medicine | Admitting: Emergency Medicine

## 2016-03-22 ENCOUNTER — Encounter (HOSPITAL_BASED_OUTPATIENT_CLINIC_OR_DEPARTMENT_OTHER): Payer: Self-pay | Admitting: Emergency Medicine

## 2016-03-22 ENCOUNTER — Emergency Department (HOSPITAL_BASED_OUTPATIENT_CLINIC_OR_DEPARTMENT_OTHER): Payer: Medicare Other

## 2016-03-22 DIAGNOSIS — Z8673 Personal history of transient ischemic attack (TIA), and cerebral infarction without residual deficits: Secondary | ICD-10-CM | POA: Diagnosis not present

## 2016-03-22 DIAGNOSIS — R0981 Nasal congestion: Secondary | ICD-10-CM | POA: Insufficient documentation

## 2016-03-22 DIAGNOSIS — E669 Obesity, unspecified: Secondary | ICD-10-CM | POA: Insufficient documentation

## 2016-03-22 DIAGNOSIS — Z794 Long term (current) use of insulin: Secondary | ICD-10-CM | POA: Insufficient documentation

## 2016-03-22 DIAGNOSIS — J45901 Unspecified asthma with (acute) exacerbation: Secondary | ICD-10-CM | POA: Diagnosis not present

## 2016-03-22 DIAGNOSIS — R6889 Other general symptoms and signs: Secondary | ICD-10-CM

## 2016-03-22 DIAGNOSIS — I509 Heart failure, unspecified: Secondary | ICD-10-CM | POA: Insufficient documentation

## 2016-03-22 DIAGNOSIS — Z87891 Personal history of nicotine dependence: Secondary | ICD-10-CM | POA: Diagnosis not present

## 2016-03-22 DIAGNOSIS — K219 Gastro-esophageal reflux disease without esophagitis: Secondary | ICD-10-CM | POA: Diagnosis not present

## 2016-03-22 DIAGNOSIS — Z79899 Other long term (current) drug therapy: Secondary | ICD-10-CM | POA: Diagnosis not present

## 2016-03-22 DIAGNOSIS — I10 Essential (primary) hypertension: Secondary | ICD-10-CM | POA: Insufficient documentation

## 2016-03-22 DIAGNOSIS — J029 Acute pharyngitis, unspecified: Secondary | ICD-10-CM | POA: Insufficient documentation

## 2016-03-22 DIAGNOSIS — Z7951 Long term (current) use of inhaled steroids: Secondary | ICD-10-CM | POA: Diagnosis not present

## 2016-03-22 DIAGNOSIS — R5383 Other fatigue: Secondary | ICD-10-CM | POA: Insufficient documentation

## 2016-03-22 DIAGNOSIS — R509 Fever, unspecified: Secondary | ICD-10-CM | POA: Insufficient documentation

## 2016-03-22 DIAGNOSIS — Z7982 Long term (current) use of aspirin: Secondary | ICD-10-CM | POA: Insufficient documentation

## 2016-03-22 DIAGNOSIS — I4892 Unspecified atrial flutter: Secondary | ICD-10-CM | POA: Insufficient documentation

## 2016-03-22 LAB — CBC WITH DIFFERENTIAL/PLATELET
BASOS ABS: 0 10*3/uL (ref 0.0–0.1)
Basophils Relative: 0 %
Eosinophils Absolute: 0.1 10*3/uL (ref 0.0–0.7)
Eosinophils Relative: 1 %
HEMATOCRIT: 29.7 % — AB (ref 36.0–46.0)
Hemoglobin: 8.9 g/dL — ABNORMAL LOW (ref 12.0–15.0)
LYMPHS PCT: 13 %
Lymphs Abs: 1 10*3/uL (ref 0.7–4.0)
MCH: 27.3 pg (ref 26.0–34.0)
MCHC: 30 g/dL (ref 30.0–36.0)
MCV: 91.1 fL (ref 78.0–100.0)
MONO ABS: 1 10*3/uL (ref 0.1–1.0)
Monocytes Relative: 12 %
Neutro Abs: 5.8 10*3/uL (ref 1.7–7.7)
Neutrophils Relative %: 74 %
Platelets: 156 10*3/uL (ref 150–400)
RBC: 3.26 MIL/uL — AB (ref 3.87–5.11)
RDW: 15.1 % (ref 11.5–15.5)
WBC: 7.8 10*3/uL (ref 4.0–10.5)

## 2016-03-22 LAB — COMPREHENSIVE METABOLIC PANEL
ALT: 12 U/L — AB (ref 14–54)
AST: 18 U/L (ref 15–41)
Albumin: 3.7 g/dL (ref 3.5–5.0)
Alkaline Phosphatase: 53 U/L (ref 38–126)
Anion gap: 8 (ref 5–15)
BILIRUBIN TOTAL: 0.8 mg/dL (ref 0.3–1.2)
BUN: 34 mg/dL — AB (ref 6–20)
CALCIUM: 8.9 mg/dL (ref 8.9–10.3)
CO2: 31 mmol/L (ref 22–32)
CREATININE: 2.04 mg/dL — AB (ref 0.44–1.00)
Chloride: 100 mmol/L — ABNORMAL LOW (ref 101–111)
GFR calc Af Amer: 29 mL/min — ABNORMAL LOW (ref >60)
GFR, EST NON AFRICAN AMERICAN: 25 mL/min — AB (ref >60)
Glucose, Bld: 119 mg/dL — ABNORMAL HIGH (ref 65–99)
Potassium: 3.7 mmol/L (ref 3.5–5.1)
Sodium: 139 mmol/L (ref 135–145)
TOTAL PROTEIN: 7.4 g/dL (ref 6.5–8.1)

## 2016-03-22 LAB — BRAIN NATRIURETIC PEPTIDE: B NATRIURETIC PEPTIDE 5: 152.8 pg/mL — AB (ref 0.0–100.0)

## 2016-03-22 LAB — RAPID STREP SCREEN (MED CTR MEBANE ONLY): Streptococcus, Group A Screen (Direct): NEGATIVE

## 2016-03-22 MED ORDER — SODIUM CHLORIDE 0.9 % IV SOLN
INTRAVENOUS | Status: DC
  Administered 2016-03-22: 16:00:00 via INTRAVENOUS

## 2016-03-22 MED ORDER — IPRATROPIUM-ALBUTEROL 0.5-2.5 (3) MG/3ML IN SOLN
3.0000 mL | Freq: Four times a day (QID) | RESPIRATORY_TRACT | Status: DC
  Administered 2016-03-22: 3 mL via RESPIRATORY_TRACT
  Filled 2016-03-22: qty 3

## 2016-03-22 MED ORDER — OSELTAMIVIR PHOSPHATE 75 MG PO CAPS: 75.0000 mg | ORAL_CAPSULE | Freq: Two times a day (BID) | ORAL | Status: DC

## 2016-03-22 MED ORDER — HYDROCODONE-ACETAMINOPHEN 5-325 MG PO TABS: 1.0000 | ORAL_TABLET | Freq: Four times a day (QID) | ORAL | Status: DC | PRN

## 2016-03-22 MED ORDER — ALBUTEROL SULFATE HFA 108 (90 BASE) MCG/ACT IN AERS: 1.0000 | INHALATION_SPRAY | Freq: Four times a day (QID) | RESPIRATORY_TRACT | Status: AC | PRN

## 2016-03-22 MED ORDER — DM-GUAIFENESIN ER 30-600 MG PO TB12: 1.0000 | ORAL_TABLET | Freq: Two times a day (BID) | ORAL | Status: DC

## 2016-03-22 NOTE — ED Provider Notes (Signed)
CSN: [id number]     Arrival date & time 03/22/16  1448 History   First MD Initiated Contact with Patient 03/22/16 1521     Chief Complaint  Patient presents with  . Shortness of Breath     (Consider location/radiation/quality/duration/timing/severity/associated sxs/prior Treatment) Patient is a 65 y.o. female presenting with shortness of breath. The history is provided by the patient.  Shortness of Breath Associated symptoms: cough, fever and sore throat   Associated symptoms: no abdominal pain, no chest pain, no headaches, no rash and no vomiting   Patient presents with 3 day history of sore throat shortness of breath cough congestion and fever feelings bodyaches. Family members with similar illness. No nausea vomiting or diarrhea. No past history of strep. Patient normally on 2 L nasal cannula oxygen at all times. Patient has a history of CHF as well as asthma.  Past Medical History  Diagnosis Date  . Hypertension   . Asthma   . CHF (congestive heart failure) (HCC)   . Atrial flutter (HCC)   . Obesity   . Stroke (HCC)   . GERD (gastroesophageal reflux disease)   . GIB (gastrointestinal bleeding)   . Bradycardia     s/p Biotronic dual chamber device   Past Surgical History  Procedure Laterality Date  . Appendectomy    . Bladder repair    . Ovary surgery    . Pacemaker insertion  2014    Biotronic   Family History  Problem Relation Age of Onset  . Diabetes Mother   . Hypertension Mother   . Heart attack Mother   . Heart attack Father   . Diabetes Brother    Social History  Substance Use Topics  . Smoking status: Former Games developer  . Smokeless tobacco: Never Used  . Alcohol Use: No   OB History    No data available     Review of Systems  Constitutional: Positive for fever and fatigue.  HENT: Positive for congestion and sore throat.   Eyes: Negative for redness.  Respiratory: Positive for cough and shortness of breath.   Cardiovascular: Negative for chest pain.   Gastrointestinal: Negative for nausea, vomiting, abdominal pain and rectal pain.  Genitourinary: Negative for dysuria.  Musculoskeletal: Positive for myalgias.  Skin: Negative for rash.  Neurological: Negative for headaches.  Hematological: Does not bruise/bleed easily.  Psychiatric/Behavioral: Negative for confusion.      Allergies  Review of patient's allergies indicates no known allergies.  Home Medications   Prior to Admission medications   Medication Sig Start Date End Date Taking? Authorizing Provider  albuterol (PROVENTIL HFA;VENTOLIN HFA) 108 (90 BASE) MCG/ACT inhaler Inhale 2 puffs into the lungs every 6 (six) hours as needed for wheezing or shortness of breath. 03/06/15   Richarda Overlie, MD  albuterol (PROVENTIL HFA;VENTOLIN HFA) 108 (90 Base) MCG/ACT inhaler Inhale 1-2 puffs into the lungs every 6 (six) hours as needed for wheezing or shortness of breath. 03/22/16   Vanetta Mulders, MD  aspirin 81 MG tablet Take 81 mg by mouth every other day.     Historical Provider, MD  budesonide-formoterol (SYMBICORT) 160-4.5 MCG/ACT inhaler Inhale 1 puff into the lungs 2 (two) times daily.     Historical Provider, MD  dextromethorphan-guaiFENesin (MUCINEX DM) 30-600 MG 12hr tablet Take 1 tablet by mouth 2 (two) times daily. 03/22/16   Vanetta Mulders, MD  dextromethorphan-guaiFENesin [facility] DM) 30-600 MG per 12 hr tablet Take 1 tablet by mouth 2 (two) times daily. 03/04/15   Richarda Overlie,  MD  ferrous fumarate (HEMOCYTE - 106 MG FE) 325 (106 FE) MG TABS tablet Take 1 tablet by mouth daily.    Historical Provider, MD  furosemide (LASIX) 20 MG tablet Take 3 tablets (60 mg total) by mouth 2 (two) times daily. 03/04/15   Richarda Overlie, MD  HYDROcodone-acetaminophen (NORCO/VICODIN) 5-325 MG tablet Take 1 tablet by mouth every 6 (six) hours as needed for severe pain. 02/20/16   Fayrene Helper, PA-C  HYDROcodone-acetaminophen (NORCO/VICODIN) 5-325 MG tablet Take 1-2 tablets by mouth every 6 (six) hours as  needed for moderate pain. 03/22/16   Vanetta Mulders, MD  insulin glargine (LANTUS) 100 UNIT/ML injection Inject 0.25 mLs (25 Units total) into the skin daily. 03/04/15   Richarda Overlie, MD  LORazepam (ATIVAN) 0.5 MG tablet Take 0.5 mg by mouth every 8 (eight) hours.    Historical Provider, MD  metaxalone (METAXALL) 800 MG tablet Take 1 tablet (800 mg total) by mouth 3 (three) times daily as needed for muscle spasms. 02/20/16   Fayrene Helper, PA-C  montelukast (SINGULAIR) 10 MG tablet Take 10 mg by mouth at bedtime.    Historical Provider, MD  oseltamivir (TAMIFLU) 75 MG capsule Take 1 capsule (75 mg total) by mouth every 12 (twelve) hours. 03/22/16   Vanetta Mulders, MD  pantoprazole (PROTONIX) 40 MG tablet Take 40 mg by mouth 2 (two) times daily.     Historical Provider, MD  predniSONE (DELTASONE) 10 MG tablet 4 tablets for 4 days 3 tablets for 4 days 2 tablets for 4 days 1 tablet for 4 days Half a tablet for 4 days then discontinue 03/04/15   Richarda Overlie, MD  tiotropium (SPIRIVA) 18 MCG inhalation capsule Place 18 mcg into inhaler and inhale daily.    Historical Provider, MD  traMADol (ULTRAM) 50 MG tablet Take 1 tablet (50 mg total) by mouth 2 (two) times daily as needed for moderate pain. 02/20/16   Fayrene Helper, PA-C   BP 103/65 mmHg  Pulse 69  Temp(Src) 99.1 F (37.3 C) (Oral)  Resp 18  Ht  (1.854 m)  Wt 134.265 kg  BMI 39.06 kg/m2  SpO2 98% Physical Exam  Constitutional: She is oriented to person, place, and time. She appears well-developed and well-nourished. No distress.  HENT:  Head: Normocephalic and atraumatic.  Mouth/Throat: Oropharynx is clear and moist. No oropharyngeal exudate.  Eyes: Conjunctivae and EOM are normal. Pupils are equal, round, and reactive to light.  Neck: Normal range of motion. Neck supple.  Cardiovascular: Normal rate, regular rhythm and normal heart sounds.   Pulmonary/Chest: Effort normal. No respiratory distress. She has wheezes.  Abdominal: Soft. There  is no tenderness.  Musculoskeletal: Normal range of motion. She exhibits no edema.  Neurological: She is alert and oriented to person, place, and time. No cranial nerve deficit. She exhibits normal muscle tone. Coordination normal.  Skin: Skin is warm. No rash noted.  Nursing note and vitals reviewed.   ED Course  Procedures (including critical care time) Labs Review Labs Reviewed  CBC WITH DIFFERENTIAL/PLATELET - Abnormal; Notable for the following:    RBC 3.26 (*)    Hemoglobin 8.9 (*)    HCT 29.7 (*)    All other components within normal limits  COMPREHENSIVE METABOLIC PANEL - Abnormal; Notable for the following:    Chloride 100 (*)    Glucose, Bld 119 (*)    BUN 34 (*)    Creatinine, Ser 2.04 (*)    ALT 12 (*)    GFR calc  non Af Amer 25 (*)    GFR calc Af Amer 29 (*)    All other components within normal limits  BRAIN NATRIURETIC PEPTIDE - Abnormal; Notable for the following:    B Natriuretic Peptide 152.8 (*)    All other components within normal limits  RAPID STREP SCREEN (NOT AT [facility])  CULTURE, GROUP A STREP [facility])    Imaging Review Dg Chest 2 View  03/22/2016  CLINICAL DATA:  Cough with sore throat and difficulty breathing for 2 days. EXAM: CHEST  2 VIEW COMPARISON:  12/13/2015 and 02/25/2015. FINDINGS: New left-sided pacemaker leads appear unchanged within the right atrium and right ventricle. The heart is enlarged. There is aortic atherosclerosis with chronic vascular congestion and mild bibasilar atelectasis. No overt pulmonary edema, pneumothorax or significant pleural effusion demonstrated. The bones appear unchanged. IMPRESSION: Stable chest with cardiomegaly and chronic vascular congestion. No acute findings. Electronically Signed   By: Carey BullocksWilliam  Veazey M.D.   On: 03/22/2016 15:52   I have personally reviewed and evaluated these images and lab results as part of my medical decision-making.   EKG Interpretation   Date/Time:  Tuesday March 22 2016 16:01:08  EDT Ventricular Rate:  69 PR Interval:    QRS Duration: 185 QT Interval:  490 QTC Calculation: 525 R Axis:   -54 Text Interpretation:  VENTRICULAR PACED RHYTHM Left bundle branch block  Confirmed by Maryam Feely  MD, Johnella Crumm ([id number]) on 03/22/2016 4:09:15 PM      MDM   Final diagnoses:  Flu-like symptoms    Patient symptoms consistent with flulike illness. Have been present for 3 days. But since patient has several comorbidities will give a course of Tamiflu. Chest x-rays negative for pneumonia. Patient nontoxic no acute distress. Patient also will be treated with albuterol inhaler Mucinex DM and hydrocodone for the sore throat bodyaches. Patient's rapid strep test is negative.    Vanetta MuldersScott Keene Gilkey, MD 03/22/16 [phone]

## 2016-03-22 NOTE — ED Notes (Signed)
Patient states that she has had a sore throat x 2 days. Now this am she started to have SOB and fever

## 2016-03-22 NOTE — ED Notes (Signed)
Patient transported to X-ray 

## 2016-03-22 NOTE — Discharge Instructions (Signed)
Take medications as directed. Return for any new or worse symptoms. °

## 2016-03-22 NOTE — ED Notes (Signed)
Pt placed on auto vitals Q30. Patient placed on cardiac monitor.  

## 2016-03-25 LAB — CULTURE, GROUP A STREP (THRC)

## 2016-03-31 ENCOUNTER — Encounter (HOSPITAL_BASED_OUTPATIENT_CLINIC_OR_DEPARTMENT_OTHER): Payer: Self-pay | Admitting: Emergency Medicine

## 2016-03-31 ENCOUNTER — Emergency Department (HOSPITAL_BASED_OUTPATIENT_CLINIC_OR_DEPARTMENT_OTHER)
Admission: EM | Admit: 2016-03-31 | Discharge: 2016-04-01 | Disposition: A | Discharge status: Home / Self Care | Payer: Medicare Other | Attending: Emergency Medicine | Admitting: Emergency Medicine

## 2016-03-31 ENCOUNTER — Emergency Department (HOSPITAL_BASED_OUTPATIENT_CLINIC_OR_DEPARTMENT_OTHER): Payer: Medicare Other

## 2016-03-31 DIAGNOSIS — I509 Heart failure, unspecified: Secondary | ICD-10-CM | POA: Insufficient documentation

## 2016-03-31 DIAGNOSIS — I110 Hypertensive heart disease with heart failure: Secondary | ICD-10-CM | POA: Insufficient documentation

## 2016-03-31 DIAGNOSIS — E669 Obesity, unspecified: Secondary | ICD-10-CM | POA: Insufficient documentation

## 2016-03-31 DIAGNOSIS — R059 Cough, unspecified: Secondary | ICD-10-CM

## 2016-03-31 DIAGNOSIS — J45909 Unspecified asthma, uncomplicated: Secondary | ICD-10-CM | POA: Insufficient documentation

## 2016-03-31 DIAGNOSIS — H109 Unspecified conjunctivitis: Secondary | ICD-10-CM | POA: Insufficient documentation

## 2016-03-31 DIAGNOSIS — Z87891 Personal history of nicotine dependence: Secondary | ICD-10-CM | POA: Insufficient documentation

## 2016-03-31 DIAGNOSIS — J449 Chronic obstructive pulmonary disease, unspecified: Secondary | ICD-10-CM | POA: Insufficient documentation

## 2016-03-31 DIAGNOSIS — H5711 Ocular pain, right eye: Secondary | ICD-10-CM | POA: Diagnosis present

## 2016-03-31 DIAGNOSIS — R05 Cough: Secondary | ICD-10-CM

## 2016-03-31 LAB — BASIC METABOLIC PANEL
Anion gap: 7 (ref 5–15)
BUN: 34 mg/dL — AB (ref 6–20)
CHLORIDE: 97 mmol/L — AB (ref 101–111)
CO2: 34 mmol/L — ABNORMAL HIGH (ref 22–32)
CREATININE: 1.93 mg/dL — AB (ref 0.44–1.00)
Calcium: 9.2 mg/dL (ref 8.9–10.3)
GFR calc Af Amer: 30 mL/min — ABNORMAL LOW (ref >60)
GFR calc non Af Amer: 26 mL/min — ABNORMAL LOW (ref >60)
GLUCOSE: 95 mg/dL (ref 65–99)
POTASSIUM: 3.9 mmol/L (ref 3.5–5.1)
Sodium: 138 mmol/L (ref 135–145)

## 2016-03-31 LAB — CBC WITH DIFFERENTIAL/PLATELET
Basophils Absolute: 0 10*3/uL (ref 0.0–0.1)
Basophils Relative: 0 %
EOS ABS: 0.2 10*3/uL (ref 0.0–0.7)
EOS PCT: 2 %
HCT: 30.6 % — ABNORMAL LOW (ref 36.0–46.0)
Hemoglobin: 9.1 g/dL — ABNORMAL LOW (ref 12.0–15.0)
LYMPHS ABS: 0.9 10*3/uL (ref 0.7–4.0)
LYMPHS PCT: 12 %
MCH: 27.2 pg (ref 26.0–34.0)
MCHC: 29.7 g/dL — AB (ref 30.0–36.0)
MCV: 91.3 fL (ref 78.0–100.0)
MONO ABS: 0.8 10*3/uL (ref 0.1–1.0)
Monocytes Relative: 10 %
Neutro Abs: 5.8 10*3/uL (ref 1.7–7.7)
Neutrophils Relative %: 76 %
PLATELETS: 226 10*3/uL (ref 150–400)
RBC: 3.35 MIL/uL — AB (ref 3.87–5.11)
RDW: 14.9 % (ref 11.5–15.5)
WBC: 7.6 10*3/uL (ref 4.0–10.5)

## 2016-03-31 LAB — TROPONIN I: Troponin I: 0.06 ng/mL — ABNORMAL HIGH (ref <0.031)

## 2016-03-31 LAB — BRAIN NATRIURETIC PEPTIDE: B NATRIURETIC PEPTIDE 5: 209.8 pg/mL — AB (ref 0.0–100.0)

## 2016-03-31 MED ORDER — IPRATROPIUM-ALBUTEROL 0.5-2.5 (3) MG/3ML IN SOLN
3.0000 mL | Freq: Once | RESPIRATORY_TRACT | Status: AC
  Administered 2016-03-31: 3 mL via RESPIRATORY_TRACT
  Filled 2016-03-31: qty 3

## 2016-03-31 NOTE — ED Notes (Signed)
Pt sts she was seen here last week for "flu-like symptoms".  Sts her eyes have been burning ever since, worse on the right, and getting worse. Also "can't get rid of this cough."

## 2016-03-31 NOTE — ED Provider Notes (Signed)
CSN: [id number]     Arrival date & time 03/31/16  2031 History  By signing my name below, I, Bethel Born, attest that this documentation has been prepared under the direction and in the presence of [employer]. Electronically Signed: Bethel Born, ED Scribe. 03/31/2016 10:22 PM    Chief Complaint  Patient presents with  . Eye pain and cough     The history is provided by the patient. No language interpreter was used.   Rebecca Welch is a 65 y.o. female with PMHx of COPD, asthma, and CHF who presents to the Emergency Department complaining of a worsening dry cough with onset last week. She was seen in the ED last week for a cough and prescribed Tamiflu that has provided no relief and she is out of her home inhaler. Associated symptoms include worsening SOB and new LE swelling. Pt states that she uses 2 L of oxygen at home as needed but has had to use it more often over the last week. She has gained 3 pounds over the last 7 days.  She also complains of bilateral burning/itching eye pain (R>L).  Her eyes are painful to the touch and she has crusting at the right eye upon waking in the morning. Pt denies chest congestion, dark or bloody stools, dysuria, and difficulty urinating.   Past Medical History  Diagnosis Date  . Hypertension   . Asthma   . CHF (congestive heart failure) (HCC)   . Atrial flutter (HCC)   . Obesity   . Stroke (HCC)   . GERD (gastroesophageal reflux disease)   . GIB (gastrointestinal bleeding)   . Bradycardia     s/p Biotronic dual chamber device   Past Surgical History  Procedure Laterality Date  . Appendectomy    . Bladder repair    . Ovary surgery    . Pacemaker insertion  2014    Biotronic   Family History  Problem Relation Age of Onset  . Diabetes Mother   . Hypertension Mother   . Heart attack Mother   . Heart attack Father   . Diabetes Brother    Social History  Substance Use Topics  . Smoking status: Former Games developer  . Smokeless  tobacco: Never Used  . Alcohol Use: No   OB History    No data available     Review of Systems 10 Systems reviewed and all are negative for acute change except as noted in the HPI.   Allergies  Advair diskus  Home Medications   Prior to Admission medications   Medication Sig Start Date End Date Taking? Authorizing Provider  albuterol (PROVENTIL HFA;VENTOLIN HFA) 108 (90 BASE) MCG/ACT inhaler Inhale 2 puffs into the lungs every 6 (six) hours as needed for wheezing or shortness of breath. 03/06/15   Richarda Overlie, MD  albuterol (PROVENTIL HFA;VENTOLIN HFA) 108 (90 Base) MCG/ACT inhaler Inhale 1-2 puffs into the lungs every 6 (six) hours as needed for wheezing or shortness of breath. 03/22/16   Vanetta Mulders, MD  aspirin 81 MG tablet Take 81 mg by mouth every other day.     Historical Provider, MD  benzonatate (TESSALON) 100 MG capsule Take 1 capsule (100 mg total) by mouth every 8 (eight) hours. 04/01/16   Joycie Peek, PA-C  budesonide-formoterol (SYMBICORT) 160-4.5 MCG/ACT inhaler Inhale 1 puff into the lungs 2 (two) times daily.     Historical Provider, MD  dextromethorphan-guaiFENesin (MUCINEX DM) 30-600 MG 12hr tablet Take 1 tablet by mouth 2 (two) times  daily. 03/22/16   Vanetta Mulders, MD  dextromethorphan-guaiFENesin [facility] DM) 30-600 MG per 12 hr tablet Take 1 tablet by mouth 2 (two) times daily. 03/04/15   Richarda Overlie, MD  erythromycin ophthalmic ointment Place a 1/2 inch ribbon of ointment into the lower eyelid. 04/01/16   Joycie Peek, PA-C  ferrous fumarate (HEMOCYTE - 106 MG FE) 325 (106 FE) MG TABS tablet Take 1 tablet by mouth daily.    Historical Provider, MD  furosemide (LASIX) 20 MG tablet Take 3 tablets (60 mg total) by mouth 2 (two) times daily. 03/04/15   Richarda Overlie, MD  guaiFENesin (ROBITUSSIN) 100 MG/5ML liquid Take 5-10 mLs (100-200 mg total) by mouth every 4 (four) hours as needed for cough. 04/01/16   Joycie Peek, PA-C  HYDROcodone-acetaminophen  (NORCO/VICODIN) 5-325 MG tablet Take 1 tablet by mouth every 6 (six) hours as needed for severe pain. 02/20/16   Fayrene Helper, PA-C  HYDROcodone-acetaminophen (NORCO/VICODIN) 5-325 MG tablet Take 1-2 tablets by mouth every 6 (six) hours as needed for moderate pain. 03/22/16   Vanetta Mulders, MD  insulin glargine (LANTUS) 100 UNIT/ML injection Inject 0.25 mLs (25 Units total) into the skin daily. 03/04/15   Richarda Overlie, MD  LORazepam (ATIVAN) 0.5 MG tablet Take 0.5 mg by mouth every 8 (eight) hours.    Historical Provider, MD  metaxalone (METAXALL) 800 MG tablet Take 1 tablet (800 mg total) by mouth 3 (three) times daily as needed for muscle spasms. 02/20/16   Fayrene Helper, PA-C  montelukast (SINGULAIR) 10 MG tablet Take 10 mg by mouth at bedtime.    Historical Provider, MD  oseltamivir (TAMIFLU) 75 MG capsule Take 1 capsule (75 mg total) by mouth every 12 (twelve) hours. 03/22/16   Vanetta Mulders, MD  pantoprazole (PROTONIX) 40 MG tablet Take 40 mg by mouth 2 (two) times daily.     Historical Provider, MD  predniSONE (DELTASONE) 10 MG tablet 4 tablets for 4 days 3 tablets for 4 days 2 tablets for 4 days 1 tablet for 4 days Half a tablet for 4 days then discontinue 03/04/15   Richarda Overlie, MD  tiotropium (SPIRIVA) 18 MCG inhalation capsule Place 18 mcg into inhaler and inhale daily.    Historical Provider, MD  traMADol (ULTRAM) 50 MG tablet Take 1 tablet (50 mg total) by mouth 2 (two) times daily as needed for moderate pain. 02/20/16   Fayrene Helper, PA-C   BP 121/63 mmHg  Pulse 69  Temp(Src) 98.8 F (37.1 C)  Resp 14  Ht  (1.854 m)  Wt 135.807 kg  BMI 39.51 kg/m2  SpO2 98% Physical Exam  Constitutional: She is oriented to person, place, and time. She appears well-developed and well-nourished. No distress.  HENT:  Head: Normocephalic and atraumatic.  Eyes: Conjunctivae and EOM are normal.  Neck: Neck supple. No tracheal deviation present.  Cardiovascular: Normal rate, regular rhythm and  normal heart sounds.   Pulmonary/Chest: Effort normal. No respiratory distress. She has rales.  Mild rales, left lower lobe with globally diminished breath sounds   Musculoskeletal: Normal range of motion. She exhibits no edema.  No pedal edema   Neurological: She is alert and oriented to person, place, and time.  Skin: Skin is warm and dry.  Psychiatric: She has a normal mood and affect. Her behavior is normal.  Nursing note and vitals reviewed.   ED Course  Procedures (including critical care time) COORDINATION OF CARE: 10:19 PM Discussed treatment plan which includes lab work, CXR, EKG, and a  breathing treatment with pt at bedside and pt agreed to plan.  Labs Review Labs Reviewed  BASIC METABOLIC PANEL - Abnormal; Notable for the following:    Chloride 97 (*)    CO2 34 (*)    BUN 34 (*)    Creatinine, Ser 1.93 (*)    GFR calc non Af Amer 26 (*)    GFR calc Af Amer 30 (*)    All other components within normal limits  CBC WITH DIFFERENTIAL/PLATELET - Abnormal; Notable for the following:    RBC 3.35 (*)    Hemoglobin 9.1 (*)    HCT 30.6 (*)    MCHC 29.7 (*)    All other components within normal limits  BRAIN NATRIURETIC PEPTIDE - Abnormal; Notable for the following:    B Natriuretic Peptide 209.8 (*)    All other components within normal limits  TROPONIN I - Abnormal; Notable for the following:    Troponin I 0.06 (*)    All other components within normal limits  TROPONIN I - Abnormal; Notable for the following:    Troponin I 0.06 (*)    All other components within normal limits    Imaging Review Dg Chest 2 View  03/31/2016  CLINICAL DATA:  Patient with flu-like symptoms for 2 weeks. Persistent cough. EXAM: CHEST  2 VIEW COMPARISON:  Chest radiograph 03/22/2016 FINDINGS: Multi lead pacer apparatus overlies the left hemi thorax, leads are stable in position. Stable cardiomegaly. Pulmonary vascular redistribution. No large area of pulmonary consolidation. Right pleural  thickening and/or small effusion. IMPRESSION: Cardiomegaly with pulmonary vascular hypertension, grossly unchanged. Electronically Signed   By: Annia Beltrew  Davis M.D.   On: 03/31/2016 23:27   I have personally reviewed and evaluated these images and lab results as part of my medical decision-making.   EKG Interpretation None     Meds given in ED:  Medications  ipratropium-albuterol (DUONEB) 0.5-2.5 (3) MG/3ML nebulizer solution 3 mL (3 mLs Nebulization Given 03/31/16 2225)    Discharge Medication List as of 04/01/2016  1:09 AM    START taking these medications   Details  benzonatate (TESSALON) 100 MG capsule Take 1 capsule (100 mg total) by mouth every 8 (eight) hours., Starting 04/01/2016, Until Discontinued, Print    erythromycin ophthalmic ointment Place a 1/2 inch ribbon of ointment into the lower eyelid., Print    guaiFENesin (ROBITUSSIN) 100 MG/5ML liquid Take 5-10 mLs (100-200 mg total) by mouth every 4 (four) hours as needed for cough., Starting 04/01/2016, Until Discontinued, Print       Filed Vitals:   03/31/16 2300 04/01/16 0000 04/01/16 0100 04/01/16 0105  BP: 108/65 116/64 121/63   Pulse: 69 71 69   Temp:      Resp: 20 15 14    Height:      Weight:      SpO2: 97% 94% 97% 98%    MDM  Rebecca Welch is a 65 y.o. female with a history of CHF, COPD comes in for evaluation of cough, eye irritation over the past one week. On arrival, she is hemodynamically stable and afebrile. 99% on home dose of 2 L oxygen via nasal cannula. On exam, she appears well, benign cardiopulmonary exam. She does not appear fluid overloaded. Right eye conjunctivitis. She politely defers any other investigatory eye tests and prefers to only have antibacterial drops for her eye. Pending chest x-ray, EKG and screening labs. Chest x-ray is stable, screening labs are unremarkable, EKG is reassuring. She does have a slightly elevated troponin,  which she has had in the past. Discussed with my attending, plan  to repeat. Low suspicion for Emergent cardiac pathology at this time. Repeat troponin remains 0.06, unchanged from prior. Patient states she feels much better after administration of breathing treatment and is ready to go home. Plan to discharge with cough medicine, antibiotic eye ointment. Discussed follow-up with PCP in 2 days for reevaluation. Prior to patient discharge, I discussed and reviewed this case with Dr. Madilyn Hook, who also saw and evaluated the patient and agrees with above plan.  Final diagnoses:  Cough  Conjunctivitis of right eye    I personally performed the services described in this documentation, which was scribed in my presence. The recorded information has been reviewed and is accurate.      Joycie Peek, PA-C 04/01/16 1629  Tilden Fossa, MD 04/03/16 [phone]

## 2016-04-01 DIAGNOSIS — H109 Unspecified conjunctivitis: Secondary | ICD-10-CM | POA: Diagnosis not present

## 2016-04-01 LAB — TROPONIN I: Troponin I: 0.06 ng/mL — ABNORMAL HIGH (ref <0.031)

## 2016-04-01 MED ORDER — BENZONATATE 100 MG PO CAPS: 100.0000 mg | ORAL_CAPSULE | Freq: Three times a day (TID) | ORAL | Status: DC

## 2016-04-01 MED ORDER — ERYTHROMYCIN 5 MG/GM OP OINT: TOPICAL_OINTMENT | OPHTHALMIC | Status: DC

## 2016-04-01 MED ORDER — GUAIFENESIN 100 MG/5ML PO LIQD: 100.0000 mg | ORAL | Status: DC | PRN

## 2016-04-01 MED ORDER — IPRATROPIUM-ALBUTEROL 0.5-2.5 (3) MG/3ML IN SOLN
3.0000 mL | Freq: Four times a day (QID) | RESPIRATORY_TRACT | Status: DC
  Administered 2016-04-01: 3 mL via RESPIRATORY_TRACT

## 2016-04-01 MED ORDER — IPRATROPIUM-ALBUTEROL 0.5-2.5 (3) MG/3ML IN SOLN
RESPIRATORY_TRACT | Status: AC
  Filled 2016-04-01: qty 3

## 2016-04-01 NOTE — Discharge Instructions (Signed)
Your exam, labs and chest x-ray are all reassuring. Take your medications as prescribed to help with your eye irritation and your cough. Follow up with your doctor the next 1-2 days reevaluation. Return to ED for any new or worsening symptoms.  Bacterial Conjunctivitis Bacterial conjunctivitis, commonly called pink eye, is an inflammation of the clear membrane that covers the white part of the eye (conjunctiva). The inflammation can also happen on the underside of the eyelids. The blood vessels in the conjunctiva become inflamed, causing the eye to become red or pink. Bacterial conjunctivitis may spread easily from one eye to another and from person to person (contagious).  CAUSES  Bacterial conjunctivitis is caused by bacteria. The bacteria may come from your own skin, your upper respiratory tract, or from someone else with bacterial conjunctivitis. SYMPTOMS  The normally white color of the eye or the underside of the eyelid is usually pink or red. The pink eye is usually associated with irritation, tearing, and some sensitivity to light. Bacterial conjunctivitis is often associated with a thick, yellowish discharge from the eye. The discharge may turn into a crust on the eyelids overnight, which causes your eyelids to stick together. If a discharge is present, there may also be some blurred vision in the affected eye. DIAGNOSIS  Bacterial conjunctivitis is diagnosed by your caregiver through an eye exam and the symptoms that you report. Your caregiver looks for changes in the surface tissues of your eyes, which may point to the specific type of conjunctivitis. A sample of any discharge may be collected on a cotton-tip swab if you have a severe case of conjunctivitis, if your cornea is affected, or if you keep getting repeat infections that do not respond to treatment. The sample will be sent to a lab to see if the inflammation is caused by a bacterial infection and to see if the infection will respond to  antibiotic medicines. TREATMENT   Bacterial conjunctivitis is treated with antibiotics. Antibiotic eyedrops are most often used. However, antibiotic ointments are also available. Antibiotics pills are sometimes used. Artificial tears or eye washes may ease discomfort. HOME CARE INSTRUCTIONS   To ease discomfort, apply a cool, clean washcloth to your eye for 10-20 minutes, 3-4 times a day.  Gently wipe away any drainage from your eye with a warm, wet washcloth or a cotton ball.  Wash your hands often with soap and water. Use paper towels to dry your hands.  Do not share towels or washcloths. This may spread the infection.  Change or wash your pillowcase every day.  You should not use eye makeup until the infection is gone.  Do not operate machinery or drive if your vision is blurred.  Stop using contact lenses. Ask your caregiver how to sterilize or replace your contacts before using them again. This depends on the type of contact lenses that you use.  When applying medicine to the infected eye, do not touch the edge of your eyelid with the eyedrop bottle or ointment tube. SEEK IMMEDIATE MEDICAL CARE IF:   Your infection has not improved within 3 days after beginning treatment.  You had yellow discharge from your eye and it returns.  You have increased eye pain.  Your eye redness is spreading.  Your vision becomes blurred.  You have a fever or persistent symptoms for more than 2-3 days.  You have a fever and your symptoms suddenly get worse.  You have facial pain, redness, or swelling. MAKE SURE YOU:   Understand  these instructions.  Will watch your condition.  Will get help right away if you are not doing well or get worse.   This information is not intended to replace advice given to you by your health care provider. Make sure you discuss any questions you have with your health care provider.   Document Released: 11/21/2005 Document Revised: 12/12/2014 Document  Reviewed: 04/23/2012 Elsevier Interactive Patient Education 2016 [employer]  Cough, Adult Coughing is a reflex that clears your throat and your airways. Coughing helps to heal and protect your lungs. It is normal to cough occasionally, but a cough that happens with other symptoms or lasts a long time may be a sign of a condition that needs treatment. A cough may last only 2-3 weeks (acute), or it may last longer than 8 weeks (chronic). CAUSES Coughing is commonly caused by:  Breathing in substances that irritate your lungs.  A viral or bacterial respiratory infection.  Allergies.  Asthma.  Postnasal drip.  Smoking.  Acid backing up from the stomach into the esophagus (gastroesophageal reflux).  Certain medicines.  Chronic lung problems, including COPD (or rarely, lung cancer).  Other medical conditions such as heart failure. HOME CARE INSTRUCTIONS  Pay attention to any changes in your symptoms. Take these actions to help with your discomfort:  Take medicines only as told by your health care provider.  If you were prescribed an antibiotic medicine, take it as told by your health care provider. Do not stop taking the antibiotic even if you start to feel better.  Talk with your health care provider before you take a cough suppressant medicine.  Drink enough fluid to keep your urine clear or pale yellow.  If the air is dry, use a cold steam vaporizer or humidifier in your bedroom or your home to help loosen secretions.  Avoid anything that causes you to cough at work or at home.  If your cough is worse at night, try sleeping in a semi-upright position.  Avoid cigarette smoke. If you smoke, quit smoking. If you need help quitting, ask your health care provider.  Avoid caffeine.  Avoid alcohol.  Rest as needed. SEEK MEDICAL CARE IF:   You have new symptoms.  You cough up pus.  Your cough does not get better after 2-3 weeks, or your cough gets worse.  You  cannot control your cough with suppressant medicines and you are losing sleep.  You develop pain that is getting worse or pain that is not controlled with pain medicines.  You have a fever.  You have unexplained weight loss.  You have night sweats. SEEK IMMEDIATE MEDICAL CARE IF:  You cough up blood.  You have difficulty breathing.  Your heartbeat is very fast.   This information is not intended to replace advice given to you by your health care provider. Make sure you discuss any questions you have with your health care provider.   Document Released: 05/20/2011 Document Revised: 08/12/2015 Document Reviewed: 01/28/2015 Elsevier Interactive Patient Education [employer]

## 2016-04-01 NOTE — ED Notes (Signed)
Pt verbalizes understanding of d/c instructions and denies any further needs at this time. 

## 2016-04-01 NOTE — ED Notes (Signed)
Pt called looking for cell phone.  Nurse looked in room 3, looked on floor, looked in restroom that pt was in and on wheelchair that nurse wheeled her out in.  EMT went through entire linen bag that pt's linens were put in and did not find anything.  Pt talked to charge nurse Dianne and was informed of such.  Staff is looking through linen again and pt states she is going to come up and look through the linen herself.

## 2016-07-16 ENCOUNTER — Emergency Department (HOSPITAL_BASED_OUTPATIENT_CLINIC_OR_DEPARTMENT_OTHER)
Admission: EM | Admit: 2016-07-16 | Discharge: 2016-07-16 | Disposition: A | Discharge status: Home / Self Care | Payer: Medicare Other | Attending: Emergency Medicine | Admitting: Emergency Medicine

## 2016-07-16 ENCOUNTER — Encounter (HOSPITAL_BASED_OUTPATIENT_CLINIC_OR_DEPARTMENT_OTHER): Payer: Self-pay | Admitting: *Deleted

## 2016-07-16 DIAGNOSIS — Z79899 Other long term (current) drug therapy: Secondary | ICD-10-CM | POA: Diagnosis not present

## 2016-07-16 DIAGNOSIS — I5033 Acute on chronic diastolic (congestive) heart failure: Secondary | ICD-10-CM | POA: Insufficient documentation

## 2016-07-16 DIAGNOSIS — Z87891 Personal history of nicotine dependence: Secondary | ICD-10-CM | POA: Diagnosis not present

## 2016-07-16 DIAGNOSIS — M7591 Shoulder lesion, unspecified, right shoulder: Secondary | ICD-10-CM | POA: Diagnosis not present

## 2016-07-16 DIAGNOSIS — I110 Hypertensive heart disease with heart failure: Secondary | ICD-10-CM | POA: Diagnosis not present

## 2016-07-16 DIAGNOSIS — M25511 Pain in right shoulder: Secondary | ICD-10-CM | POA: Diagnosis present

## 2016-07-16 DIAGNOSIS — J449 Chronic obstructive pulmonary disease, unspecified: Secondary | ICD-10-CM | POA: Insufficient documentation

## 2016-07-16 DIAGNOSIS — J45909 Unspecified asthma, uncomplicated: Secondary | ICD-10-CM | POA: Insufficient documentation

## 2016-07-16 DIAGNOSIS — M7581 Other shoulder lesions, right shoulder: Secondary | ICD-10-CM

## 2016-07-16 HISTORY — DX: Sleep apnea, unspecified: G47.30

## 2016-07-16 MED ORDER — HYDROCODONE-ACETAMINOPHEN 5-325 MG PO TABS: 2.0000 | ORAL_TABLET | ORAL | 0 refills | Status: AC | PRN

## 2016-07-16 MED ORDER — LIDOCAINE 5 % EX PTCH: 1.0000 | MEDICATED_PATCH | CUTANEOUS | 0 refills | Status: AC

## 2016-07-16 NOTE — ED Provider Notes (Signed)
[facility] Note   CSN: [id number] Arrival date & time: 07/16/16  1547  First Provider Contact:  First MD Initiated Contact with Patient 07/16/16 1733     By signing my name below, I, Vista Minkobert Ross, attest that this documentation has been prepared under the direction and in the presence of Rolland PorterMark Lawson Isabell, MD. Electronically signed, Vista Minkobert Ross, ED Scribe. 07/16/16. 5:45 PM.  History   Chief Complaint Chief Complaint  Patient presents with  . Shoulder Pain    HPI HPI Comments: Rebecca Welch is a 65 y.o. Female with a PMHx of Afib, CHF, GERD, GIB, HTN, Obesity, Sleep apnea, Stroke, who presents to the Emergency Department complaining of gradually worsening, constant, right shoulder pain that started 3-4 days ago. Pt states she woke up approximately four days ago with pain in her right shoulder. Pt complains of pain to the posterior area of the shoulder and pain to her right hand and arm that started afterwards. Pt reports difficulty lifting the arm due to pain. Pt further reports that she has been having difficulty sleeping due to pain. Pt denies Hx of similar pain. Pt denies recent injury. She further denies recent use of steroids or antibiotics.   The history is provided by the patient. No language interpreter was used.   Past Medical History:  Diagnosis Date  . Asthma   . Atrial flutter (HCC)   . Bradycardia    s/p Biotronic dual chamber device  . CHF (congestive heart failure) (HCC)   . GERD (gastroesophageal reflux disease)   . GIB (gastrointestinal bleeding)   . Hypertension   . Obesity   . Sleep apnea   . Stroke [facility](HCC) 2010   Left eye stroke    Patient Active Problem List   Diagnosis Date Noted  . Acute on chronic diastolic congestive heart failure (HCC)   . Dyspnea 02/24/2015  . GERD (gastroesophageal reflux disease) 02/24/2015  . SOB (shortness of breath) 02/24/2015  . COPD exacerbation (HCC) 02/24/2015  . Asthma with acute exacerbation  02/24/2015  . OSA (obstructive sleep apnea) 02/24/2015  . Hypertension   . CHF (congestive heart failure) (HCC)   . Atrial flutter (HCC)   . Obesity   . Stroke (HCC)   . Abdominal pain     Past Surgical History:  Procedure Laterality Date  . APPENDECTOMY    . BLADDER REPAIR    . OVARY SURGERY    . PACEMAKER INSERTION  2014   Biotronic  . TONSILLECTOMY      OB History    No data available       Home Medications    Prior to Admission medications   Medication Sig Start Date End Date Taking? Authorizing Provider  albuterol (PROVENTIL HFA;VENTOLIN HFA) 108 (90 BASE) MCG/ACT inhaler Inhale 2 puffs into the lungs every 6 (six) hours as needed for wheezing or shortness of breath. 03/06/15  Yes Richarda OverlieNayana Abrol, MD  benzonatate (TESSALON) 100 MG capsule Take 1 capsule (100 mg total) by mouth every 8 (eight) hours. 04/01/16  Yes Benjamin Cartner, PA-C  budesonide-formoterol (SYMBICORT) 160-4.5 MCG/ACT inhaler Inhale 1 puff into the lungs 2 (two) times daily.    Yes Historical Provider, MD  dextromethorphan-guaiFENesin (MUCINEX DM) 30-600 MG 12hr tablet Take 1 tablet by mouth 2 (two) times daily. 03/22/16  Yes Vanetta MuldersScott Zackowski, MD  erythromycin ophthalmic ointment Place a 1/2 inch ribbon of ointment into the lower eyelid. 04/01/16  Yes Joycie PeekBenjamin Cartner, PA-C  ferrous fumarate (HEMOCYTE - 106 MG FE) 325 (  106 FE) MG TABS tablet Take 1 tablet by mouth daily.   Yes Historical Provider, MD  guaiFENesin (ROBITUSSIN) 100 MG/5ML liquid Take 5-10 mLs (100-200 mg total) by mouth every 4 (four) hours as needed for cough. 04/01/16  Yes Benjamin Cartner, PA-C  LORazepam (ATIVAN) 0.5 MG tablet Take 0.5 mg by mouth every 8 (eight) hours.   Yes Historical Provider, MD  metaxalone (METAXALL) 800 MG tablet Take 1 tablet (800 mg total) by mouth 3 (three) times daily as needed for muscle spasms. 02/20/16  Yes Fayrene Helper, PA-C  metolazone (ZAROXOLYN) 2.5 MG tablet Take 2.5 mg by mouth daily.   Yes Historical Provider, MD   montelukast (SINGULAIR) 10 MG tablet Take 10 mg by mouth at bedtime.   Yes Historical Provider, MD  pantoprazole (PROTONIX) 40 MG tablet Take 40 mg by mouth 2 (two) times daily.    Yes Historical Provider, MD  tiotropium (SPIRIVA) 18 MCG inhalation capsule Place 18 mcg into inhaler and inhale daily.   Yes Historical Provider, MD  torsemide (DEMADEX) 20 MG tablet Take 20 mg by mouth 2 (two) times daily.   Yes Historical Provider, MD  albuterol (PROVENTIL HFA;VENTOLIN HFA) 108 (90 Base) MCG/ACT inhaler Inhale 1-2 puffs into the lungs every 6 (six) hours as needed for wheezing or shortness of breath. 03/22/16   Vanetta Mulders, MD  aspirin 81 MG tablet Take 81 mg by mouth every other day.     Historical Provider, MD  dextromethorphan-guaiFENesin (MUCINEX DM) 30-600 MG per 12 hr tablet Take 1 tablet by mouth 2 (two) times daily. 03/04/15   Richarda Overlie, MD  furosemide (LASIX) 20 MG tablet Take 3 tablets (60 mg total) by mouth 2 (two) times daily. 03/04/15   Richarda Overlie, MD  HYDROcodone-acetaminophen (NORCO/VICODIN) 5-325 MG tablet Take 2 tablets by mouth every 4 (four) hours as needed. 07/16/16   Rolland Porter, MD  insulin glargine (LANTUS) 100 UNIT/ML injection Inject 0.25 mLs (25 Units total) into the skin daily. 03/04/15   Richarda Overlie, MD  lidocaine (LIDODERM) 5 % Place 1 patch onto the skin daily. Remove & Discard patch within 12 hours or as directed by MD 07/16/16   Rolland Porter, MD  oseltamivir (TAMIFLU) 75 MG capsule Take 1 capsule (75 mg total) by mouth every 12 (twelve) hours. 03/22/16   Vanetta Mulders, MD  predniSONE (DELTASONE) 10 MG tablet 4 tablets for 4 days 3 tablets for 4 days 2 tablets for 4 days 1 tablet for 4 days Half a tablet for 4 days then discontinue 03/04/15   Richarda Overlie, MD  traMADol (ULTRAM) 50 MG tablet Take 1 tablet (50 mg total) by mouth 2 (two) times daily as needed for moderate pain. 02/20/16   Fayrene Helper, PA-C    Family History Family History  Problem Relation Age of Onset   . Diabetes Mother   . Hypertension Mother   . Heart attack Mother   . Heart attack Father   . Diabetes Brother     Social History Social History  Substance Use Topics  . Smoking status: Former Games developer  . Smokeless tobacco: Never Used  . Alcohol use No     Allergies   Advair diskus [fluticasone-salmeterol]   Review of Systems Review of Systems  Constitutional: Negative for appetite change, chills, diaphoresis, fatigue and fever.  HENT: Negative for mouth sores, sore throat and trouble swallowing.   Eyes: Negative for visual disturbance.  Respiratory: Negative for cough, chest tightness, shortness of breath and wheezing.   Cardiovascular:  Negative for chest pain.  Gastrointestinal: Negative for abdominal distention, abdominal pain, diarrhea, nausea and vomiting.  Endocrine: Negative for polydipsia, polyphagia and polyuria.  Genitourinary: Negative for dysuria, frequency and hematuria.  Musculoskeletal: Positive for arthralgias (right shoulder, arm and hand.). Negative for gait problem.  Skin: Negative for color change, pallor and rash.  Neurological: Negative for dizziness, syncope, light-headedness and headaches.  Hematological: Does not bruise/bleed easily.  Psychiatric/Behavioral: Positive for sleep disturbance (due to pain). Negative for behavioral problems and confusion.  All other systems reviewed and are negative.    Physical Exam Updated Vital Signs BP 150/79 (BP Location: Left Arm)   Pulse 72   Temp 98.1 F (36.7 C) (Oral)   Resp 20   Ht 6\' 1"  (1.854 m)   Wt 295 lb 6.4 oz (134 kg)   SpO2 96%   BMI 38.97 kg/m   Physical Exam  Constitutional: She is oriented to person, place, and time. She appears well-developed and well-nourished. No distress.  HENT:  Head: Normocephalic.  Eyes: Conjunctivae are normal. Pupils are equal, round, and reactive to light. No scleral icterus.  Neck: Normal range of motion. Neck supple. No thyromegaly present.    Cardiovascular: Normal rate and regular rhythm.  Exam reveals no gallop and no friction rub.   No murmur heard. Pulmonary/Chest: Effort normal and breath sounds normal. No respiratory distress. She has no wheezes. She has no rales.  Abdominal: Soft. Bowel sounds are normal. She exhibits no distension. There is no tenderness. There is no rebound.  Musculoskeletal: Normal range of motion. She exhibits tenderness.  Point tender supraspinatus fossa. Pain reproduced with internal external rotation and bicep flexion. Normal NV  Neurological: She is alert and oriented to person, place, and time.  Skin: Skin is warm and dry. No rash noted.  Psychiatric: She has a normal mood and affect. Her behavior is normal.    ED Treatments / Results  DIAGNOSTIC STUDIES: Oxygen Saturation is 96% on RA, normal by my interpretation.  COORDINATION OF CARE: 5:39 PM-Will order medication to control pain and possible follow up with ortho. Discussed treatment plan with pt at bedside and pt agreed to plan.   Labs (all labs ordered are listed, but only abnormal results are displayed) Labs Reviewed - No data to display  EKG  EKG Interpretation None       Radiology No results found.  Procedures Procedures (including critical care time)  Medications Ordered in ED Medications - No data to display   Initial Impression / Assessment and Plan / ED Course  I have reviewed the triage vital signs and the nursing notes.  Pertinent labs & imaging results that were available during my care of the patient were reviewed by me and considered in my medical decision making (see chart for details).  Clinical Course      Final Clinical Impressions(s) / ED Diagnoses   Final diagnoses:  Rotator cuff tendonitis, right    New Prescriptions New Prescriptions   HYDROCODONE-ACETAMINOPHEN (NORCO/VICODIN) 5-325 MG TABLET    Take 2 tablets by mouth every 4 (four) hours as needed.   LIDOCAINE (LIDODERM) 5 %    Place 1  patch onto the skin daily. Remove & Discard patch within 12 hours or as directed by MD      Rolland Porter, MD 07/16/16 [phone]

## 2016-07-16 NOTE — ED Notes (Signed)
MD at bedside. 

## 2016-07-16 NOTE — ED Triage Notes (Signed)
Patient states she woke up approximately 4-5 days ago with pain in the right shoulder.  States she initially had pain in the right lower posterior lung area that was associated with sob.  The lung pain and sob has improved.  States she continues to have pain in the left neck, shoulder with sharp pain radiating into the upper right arm.  Pt has sleep apnea, and O2 24/7.

## 2016-07-16 NOTE — Discharge Instructions (Signed)
Limited use of your right arm until your symptoms improved.  Apply lidocaine patches once per day for pain.  Ice for 20 minutes at a time 3 times per day for pain.  Follow-up with her primary care physician if not improving.

## 2016-07-28 ENCOUNTER — Encounter: Payer: Self-pay | Admitting: Family Medicine

## 2016-07-28 ENCOUNTER — Ambulatory Visit (INDEPENDENT_AMBULATORY_CARE_PROVIDER_SITE_OTHER): Payer: Medicare Other | Admitting: Family Medicine

## 2016-07-28 VITALS — BP 120/75 | HR 76 | Ht 73.0 in | Wt 296.0 lb

## 2016-07-28 DIAGNOSIS — M25511 Pain in right shoulder: Secondary | ICD-10-CM

## 2016-07-28 DIAGNOSIS — M19011 Primary osteoarthritis, right shoulder: Secondary | ICD-10-CM

## 2016-07-28 DIAGNOSIS — M79644 Pain in right finger(s): Secondary | ICD-10-CM

## 2016-07-28 DIAGNOSIS — M25512 Pain in left shoulder: Secondary | ICD-10-CM

## 2016-07-28 DIAGNOSIS — M129 Arthropathy, unspecified: Secondary | ICD-10-CM | POA: Diagnosis not present

## 2016-07-28 MED ORDER — METHYLPREDNISOLONE ACETATE 40 MG/ML IJ SUSP
40.0000 mg | Freq: Once | INTRAMUSCULAR | Status: AC
  Administered 2016-07-28: 40 mg via INTRA_ARTICULAR

## 2016-07-28 MED ORDER — METHYLPREDNISOLONE ACETATE 40 MG/ML IJ SUSP
20.0000 mg | Freq: Once | INTRAMUSCULAR | Status: AC
  Administered 2016-07-28: 20 mg via INTRA_ARTICULAR

## 2016-07-28 NOTE — Patient Instructions (Signed)
Your left shoulder pain is due to combination of rotator cuff impingement, scar tissue in the shoulder. Try to avoid painful activities (overhead activities, lifting with extended arm) as much as possible. Consider topical voltaren gel four times a day. Can take tylenol in addition to this. Injection may be beneficial to help with pain and to decrease inflammation - you were given this today. Start physical therapy with transition to home exercise program (we will order home health physical therapy). Arm circles, pendulums, table slides to help regain motion. Do home exercise program with theraband and scapular stabilization exercises daily - these are very important for long term relief even if an injection was given. If not improving at follow-up we will consider further imaging and/or nitro patches. Follow up with me in 5-6 weeks.  Your right shoulder pain is due to [facility] arthritis, left finger pain due to arthritis. These are the different medications you can use for this: Tylenol 500mg  1-2 tabs three times a day for pain. Consider topical voltaren gel. Glucosamine sulfate 750mg  twice a day is a supplement that may help. Capsaicin, aspercreme, or biofreeze topically up to four times a day may also help with pain. Cortisone injections are an option. It's important that you continue to stay active. Heat or ice 15 minutes at a time 3-4 times a day as needed to help with pain. Finger splint as much as possible to help rest this.

## 2016-08-09 ENCOUNTER — Telehealth: Payer: Self-pay | Admitting: Family Medicine

## 2016-08-09 DIAGNOSIS — M79644 Pain in right finger(s): Secondary | ICD-10-CM | POA: Insufficient documentation

## 2016-08-09 DIAGNOSIS — M25511 Pain in right shoulder: Secondary | ICD-10-CM | POA: Insufficient documentation

## 2016-08-09 DIAGNOSIS — M25512 Pain in left shoulder: Secondary | ICD-10-CM

## 2016-08-09 NOTE — Assessment & Plan Note (Signed)
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  Difficulty with ambulation due to medical conditions - will write for her to get home health PT.  Injections given as well.  Discussed tylenol, glucosamine, topical medications.  F/u in 5-6 weeks.  After informed written consent, patient was seated in wheelchair. Left shoulder was prepped with alcohol swab and utilizing posterior approach, patient's left shoulder was injected with 6:2 marcaine:depomedrol with half in the subacromial space and half in glenohumeral space.  Patient tolerated the procedure well without immediate complications.  After informed written consent, patient was seated on exam table. Right AC joint was prepped with alcohol swab and injected with 0.5:0.15mL marcaine:depomedrol. Patient tolerated the procedure well without immediate complications.

## 2016-08-09 NOTE — Assessment & Plan Note (Signed)
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  Discussed topical medications, tylenol, glucosamine, heat soaks.  Finger splint to help rest as well.

## 2016-08-09 NOTE — Progress Notes (Signed)
PCP: WOODYEAR,WYNNE E, MD  Subjective:   HPI: Patient is a 65 y.o. female here for bilateral shoulder pain, left index finger pain.  Patient reports for 2 weeks she's had worsening bilateral shoulder pain. Pain in right shoulder more superior, left shoulder deep and lateral. Pain is 7/10 both but up to 10/10 and sharp. Using lidocaine patches. Difficulty with mobility, h/o CHF and a CVA. Also with pain in left index finger around the joints but no catching or locking. Pain can be up to 10/10 here also and sharp. No skin changes, numbness.  Past Medical History:  Diagnosis Date  . Asthma   . Atrial flutter (HCC)   . Bradycardia    s/p Biotronic dual chamber device  . CHF (congestive heart failure) (HCC)   . GERD (gastroesophageal reflux disease)   . GIB (gastrointestinal bleeding)   . Hypertension   . Obesity   . Sleep apnea   . Stroke [facility](HCC) 2010   Left eye stroke    Current Outpatient Prescriptions on File Prior to Visit  Medication Sig Dispense Refill  . albuterol (PROVENTIL HFA;VENTOLIN HFA) 108 (90 Base) MCG/ACT inhaler Inhale 1-2 puffs into the lungs every 6 (six) hours as needed for wheezing or shortness of breath. 1 Inhaler 0  . aspirin 81 MG tablet Take 81 mg by mouth every other day.     . budesonide-formoterol (SYMBICORT) 160-4.5 MCG/ACT inhaler Inhale 1 puff into the lungs 2 (two) times daily.     . ferrous fumarate (HEMOCYTE - 106 MG FE) 325 (106 FE) MG TABS tablet Take 1 tablet by mouth daily.    . furosemide (LASIX) 20 MG tablet Take 3 tablets (60 mg total) by mouth 2 (two) times daily. 120 tablet 1  . HYDROcodone-acetaminophen (NORCO/VICODIN) 5-325 MG tablet Take 2 tablets by mouth every 4 (four) hours as needed. 10 tablet 0  . insulin glargine (LANTUS) 100 UNIT/ML injection Inject 0.25 mLs (25 Units total) into the skin daily. 10 mL 11  . lidocaine (LIDODERM) 5 % Place 1 patch onto the skin daily. Remove & Discard patch within 12 hours or as directed by MD 10  patch 0  . LORazepam (ATIVAN) 0.5 MG tablet Take 0.5 mg by mouth every 8 (eight) hours.    . metaxalone (METAXALL) 800 MG tablet Take 1 tablet (800 mg total) by mouth 3 (three) times daily as needed for muscle spasms. 20 tablet 0  . metolazone (ZAROXOLYN) 2.5 MG tablet Take 2.5 mg by mouth daily.    . montelukast (SINGULAIR) 10 MG tablet Take 10 mg by mouth at bedtime.    . pantoprazole (PROTONIX) 40 MG tablet Take 40 mg by mouth 2 (two) times daily.     Marland Kitchen. tiotropium (SPIRIVA) 18 MCG inhalation capsule Place 18 mcg into inhaler and inhale daily.    Marland Kitchen. torsemide (DEMADEX) 20 MG tablet Take 20 mg by mouth 2 (two) times daily.     No current facility-administered medications on file prior to visit.     Past Surgical History:  Procedure Laterality Date  . APPENDECTOMY    . BLADDER REPAIR    . OVARY SURGERY    . PACEMAKER INSERTION  2014   Biotronic  . TONSILLECTOMY      Allergies  Allergen Reactions  . Advair Diskus [Fluticasone-Salmeterol]     Social History   Social History  . Marital status: Single    Spouse name: N/A  . Number of children: N/A  . Years of education: N/A  Occupational History  . Not on file.   Social History Main Topics  . Smoking status: Former Games developer  . Smokeless tobacco: Never Used  . Alcohol use No  . Drug use: No  . Sexual activity: Not on file   Other Topics Concern  . Not on file   Social History Narrative  . No narrative on file    Family History  Problem Relation Age of Onset  . Diabetes Mother   . Hypertension Mother   . Heart attack Mother   . Heart attack Father   . Diabetes Brother     BP 120/75   Pulse 76   Ht 6\' 1"  (1.854 m)   Wt 296 lb (134.3 kg)   BMI 39.05 kg/m   Review of Systems: See HPI above.    Objective:  Physical Exam:  Gen: NAD, comfortable in exam room  Right shoulder: No swelling, ecchymoses.  No gross deformity. TTP AC joint reproducing pain. FROM with painful arc. Negative Hawkins,  Neers. Negative Yergasons. Strength 5/5 with empty can and resisted internal/external rotation. Positive adduction. Negative apprehension. NV intact distally.  Left shoulder: No swelling, ecchymoses.  No gross deformity. No TTP. Painful arc.  100 degrees abduction and flexion, 30 degrees ER. Positive Hawkins, Neers. Negative Speeds, Yergasons. Strength 5/5 with empty can and resisted internal/external rotation.  Pain with empty can NV intact distally.  Left 2nd digit: No skin changes, swelling, malrotation or angulation. FROM at MCP, DIP, PIP joints. No tenderness at A1 pulley or elsewhere. NVI distally.  Assessment & Plan:  1. Bilateral shoulder pain - separate issues in each shoulder based on exam - AC arthropathy of right shoulder, impingement and adhesive capsulitis on left.  Difficulty with ambulation due to medical conditions - will write for her to get home health PT.  Injections given as well.  Discussed tylenol, glucosamine, topical medications.  F/u in 5-6 weeks.  After informed written consent, patient was seated in wheelchair. Left shoulder was prepped with alcohol swab and utilizing posterior approach, patient's left shoulder was injected with 6:2 marcaine:depomedrol with half in the subacromial space and half in glenohumeral space.  Patient tolerated the procedure well without immediate complications.  After informed written consent, patient was seated on exam table. Right AC joint was prepped with alcohol swab and injected with 0.5:0.26mL marcaine:depomedrol. Patient tolerated the procedure well without immediate complications.  2. 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  Discussed topical medications, tylenol, glucosamine, heat soaks.  Finger splint to help rest as well.

## 2016-09-08 ENCOUNTER — Encounter: Payer: Self-pay | Admitting: Family Medicine

## 2016-09-08 ENCOUNTER — Ambulatory Visit (INDEPENDENT_AMBULATORY_CARE_PROVIDER_SITE_OTHER): Payer: Medicare Other | Admitting: Family Medicine

## 2016-09-08 DIAGNOSIS — M25511 Pain in right shoulder: Secondary | ICD-10-CM | POA: Diagnosis present

## 2016-09-08 DIAGNOSIS — M79644 Pain in right finger(s): Secondary | ICD-10-CM | POA: Diagnosis not present

## 2016-09-08 DIAGNOSIS — M25512 Pain in left shoulder: Secondary | ICD-10-CM

## 2016-09-08 NOTE — Patient Instructions (Signed)
Your shoulders are much better. If your finger bothers you enough that you want an injection let me know. Otherwise follow up with me as needed.

## 2016-09-17 NOTE — Assessment & Plan Note (Signed)
2/2 DJD.  Discussed topical medications, tylenol, glucosamine, heat soaks.  Advised if she would like to try injection at some point to call us.

## 2016-09-17 NOTE — Progress Notes (Signed)
PCP: WOODYEAR,WYNNE E, MD  Subjective:   HPI: Patient is a 65 y.o. female here for bilateral shoulder pain, left index finger pain.  8/24: Patient reports for 2 weeks she's had worsening bilateral shoulder pain. Pain in right shoulder more superior, left shoulder deep and lateral. Pain is 7/10 both but up to 10/10 and sharp. Using lidocaine patches. Difficulty with mobility, h/o CHF and a CVA. Also with pain in left index finger around the joints but no catching or locking. Pain can be up to 10/10 here also and sharp. No skin changes, numbness.  10/5: Patient reports she feels significantly better. Shoulders have no pain, much better motion following injections. Left index finger still with pain in joints but tolerable. No skin changes, numbness.  Past Medical History:  Diagnosis Date  . Asthma   . Atrial flutter (HCC)   . Bradycardia    s/p Biotronic dual chamber device  . CHF (congestive heart failure) (HCC)   . GERD (gastroesophageal reflux disease)   . GIB (gastrointestinal bleeding)   . Hypertension   . Obesity   . Sleep apnea   . Stroke [facility]) 2010   Left eye stroke    Current Outpatient Prescriptions on File Prior to Visit  Medication Sig Dispense Refill  . albuterol (PROVENTIL HFA;VENTOLIN HFA) 108 (90 Base) MCG/ACT inhaler Inhale 1-2 puffs into the lungs every 6 (six) hours as needed for wheezing or shortness of breath. 1 Inhaler 0  . aspirin 81 MG tablet Take 81 mg by mouth every other day.     Marland Kitchen BAYER CONTOUR TEST test strip 2 (two) times daily. for testing  0  . budesonide-formoterol (SYMBICORT) 160-4.5 MCG/ACT inhaler Inhale 1 puff into the lungs 2 (two) times daily.     Marland Kitchen DALIRESP 500 MCG TABS tablet     . ferrous fumarate (HEMOCYTE - 106 MG FE) 325 (106 FE) MG TABS tablet Take 1 tablet by mouth daily.    . fluticasone (FLONASE) 50 MCG/ACT nasal spray     . furosemide (LASIX) 20 MG tablet Take 3 tablets (60 mg total) by mouth 2 (two) times daily. 120  tablet 1  . HYDROcodone-acetaminophen (NORCO/VICODIN) 5-325 MG tablet Take 2 tablets by mouth every 4 (four) hours as needed. 10 tablet 0  . insulin glargine (LANTUS) 100 UNIT/ML injection Inject 0.25 mLs (25 Units total) into the skin daily. 10 mL 11  . lidocaine (LIDODERM) 5 % Place 1 patch onto the skin daily. Remove & Discard patch within 12 hours or as directed by MD 10 patch 0  . LORazepam (ATIVAN) 0.5 MG tablet Take 0.5 mg by mouth every 8 (eight) hours.    . metaxalone (METAXALL) 800 MG tablet Take 1 tablet (800 mg total) by mouth 3 (three) times daily as needed for muscle spasms. 20 tablet 0  . metolazone (ZAROXOLYN) 2.5 MG tablet Take 2.5 mg by mouth daily.    . metoprolol succinate (TOPROL-XL) 50 MG 24 hr tablet     . montelukast (SINGULAIR) 10 MG tablet Take 10 mg by mouth at bedtime.    . pantoprazole (PROTONIX) 40 MG tablet Take 40 mg by mouth 2 (two) times daily.     . potassium chloride (K-DUR) 10 MEQ tablet     . tiotropium (SPIRIVA) 18 MCG inhalation capsule Place 18 mcg into inhaler and inhale daily.    Marland Kitchen torsemide (DEMADEX) 20 MG tablet Take 20 mg by mouth 2 (two) times daily.     No current facility-administered medications on  file prior to visit.     Past Surgical History:  Procedure Laterality Date  . APPENDECTOMY    . BLADDER REPAIR    . OVARY SURGERY    . PACEMAKER INSERTION  2014   Biotronic  . TONSILLECTOMY      Allergies  Allergen Reactions  . Advair Diskus [Fluticasone-Salmeterol]     Social History   Social History  . Marital status: Single    Spouse name: N/A  . Number of children: N/A  . Years of education: N/A   Occupational History  . Not on file.   Social History Main Topics  . Smoking status: Former Games developermoker  . Smokeless tobacco: Never Used  . Alcohol use No  . Drug use: No  . Sexual activity: Not on file   Other Topics Concern  . Not on file   Social History Narrative  . No narrative on file    Family History  Problem  Relation Age of Onset  . Diabetes Mother   . Hypertension Mother   . Heart attack Mother   . Heart attack Father   . Diabetes Brother     BP 111/71   Pulse 92   Ht 6\' 1"  (1.854 m)   Wt 291 lb (132 kg)   BMI 38.39 kg/m   Review of Systems: See HPI above.    Objective:  Physical Exam:  Gen: NAD, comfortable in exam room  Right shoulder: No swelling, ecchymoses.  No gross deformity. No TTP. FROM with negative painful arc. Negative Hawkins, Neers. Strength 5/5 with empty can and resisted internal/external rotation. Negative apprehension. NV intact distally.  Left shoulder: No swelling, ecchymoses.  No gross deformity. No TTP. FROM with negative painful arc. Negative Hawkins, Neers. Strength 5/5 with empty can and resisted internal/external rotation. NV intact distally.  Left 2nd digit: No skin changes, swelling, malrotation or angulation. FROM at MCP, DIP, PIP joints. No tenderness at A1 pulley or elsewhere. NVI distally.  Assessment & Plan:  1. Bilateral shoulder pain - separate issues in each shoulder based on exam - AC arthropathy of right shoulder, impingement and adhesive capsulitis on left.  Significant improvement following last visit, injections in pain level and motion.  Continue home exercises.  Previously discussed tylenol, glucosamine, topical medications.  F/u prn.  2. 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  Discussed topical medications, tylenol, glucosamine, heat soaks.  Advised if she would like to try injection at some point to call us.

## 2016-09-17 NOTE — Assessment & Plan Note (Signed)
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  Significant improvement following last visit, injections in pain level and motion.  Continue home exercises.  Previously discussed tylenol, glucosamine, topical medications.  F/u prn.

## 2016-12-06 NOTE — Telephone Encounter (Signed)
Finished

## 2017-05-05 DEATH — deceased

## 2017-06-10 IMAGING — CT CT TEMPORAL BONES W/O CM
3 of 6 series · 13 of 40 positions shown, 15 images · non-contrast
Comparison: None.

CLINICAL DATA: Right otitis externa. Pain for 3 days involving the
right ear, temple, and angle of the mandible.

EXAM:
CT TEMPORAL BONES WITHOUT CONTRAST
TECHNIQUE: Axial and coronal plane CT imaging of the petrous temporal bones was
performed with thin-collimation image reconstruction. No intravenous
contrast was administered. Multiplanar CT image reconstructions were
also generated.

[Series 4: coronal bone · coronal · 0.29mm/px · 2 of 236 slices shown]
[im 79/236  bone]
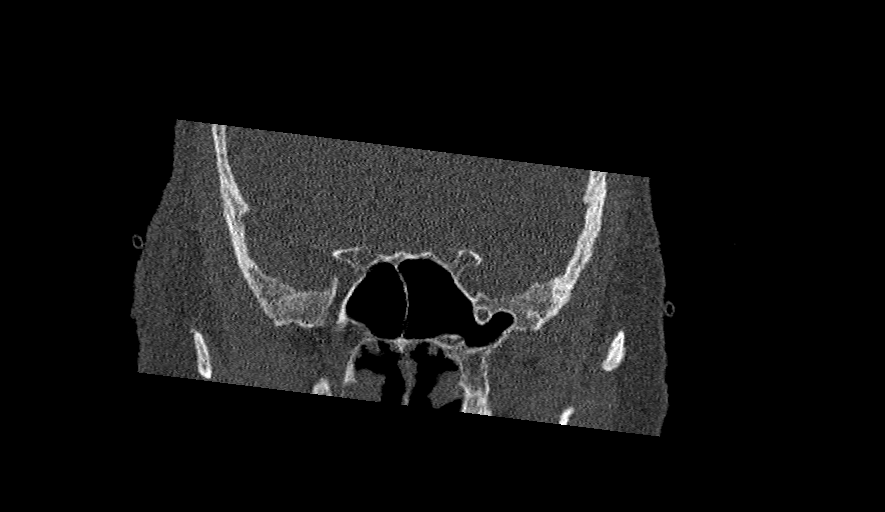
[im 157/236  bone]
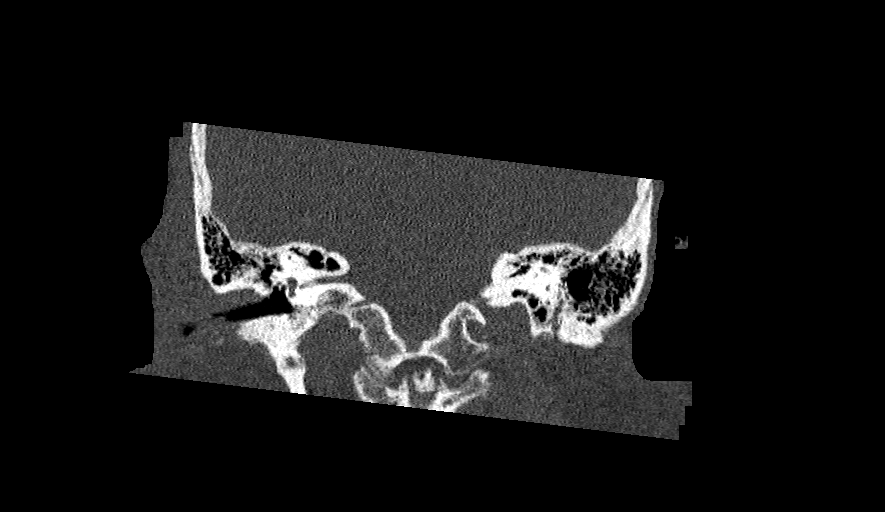

[Series 5: ax mag right · axial · 0.20mm/px · z∈[-123,-80]mm · 7 of 95 slices shown, 9 images]
[im 12/95  brain]
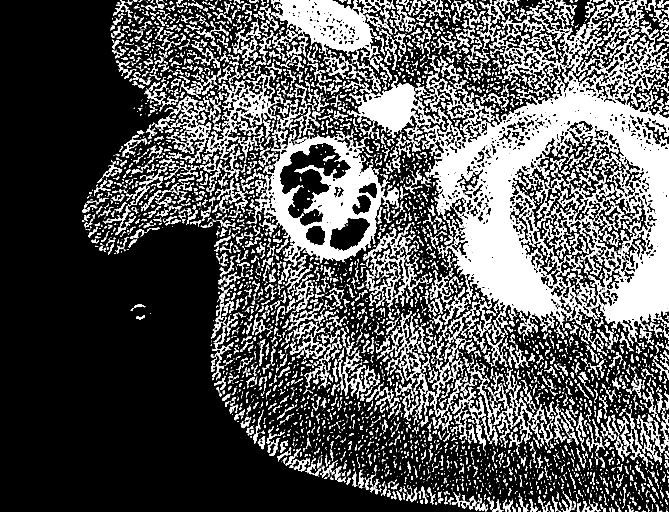
[im 12/95  bone]
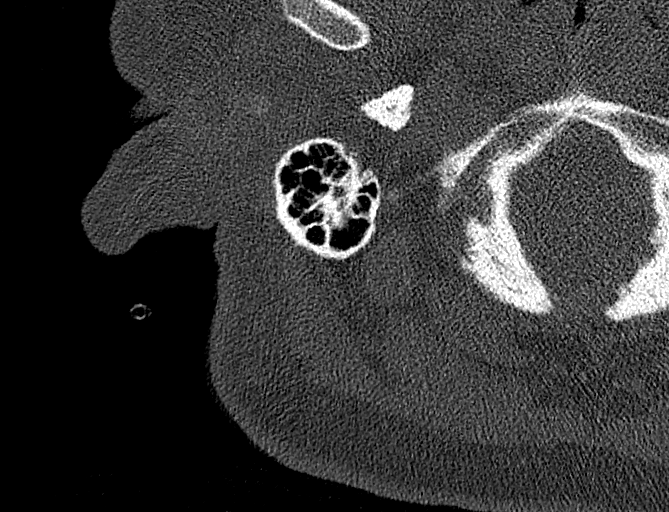
[im 24/95  bone]
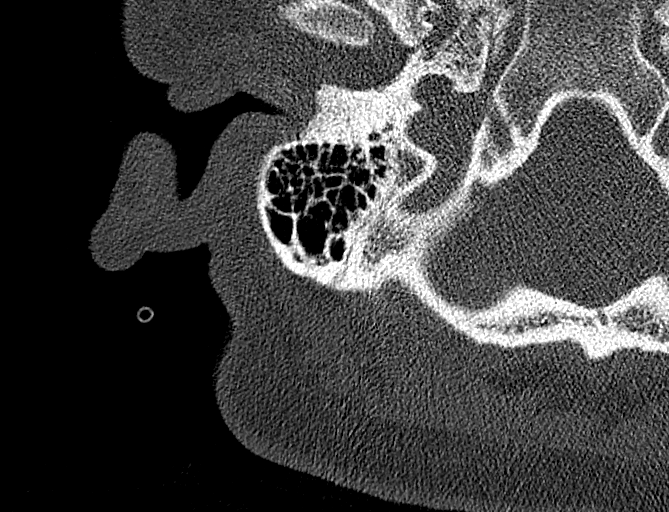
[im 36/95  bone]
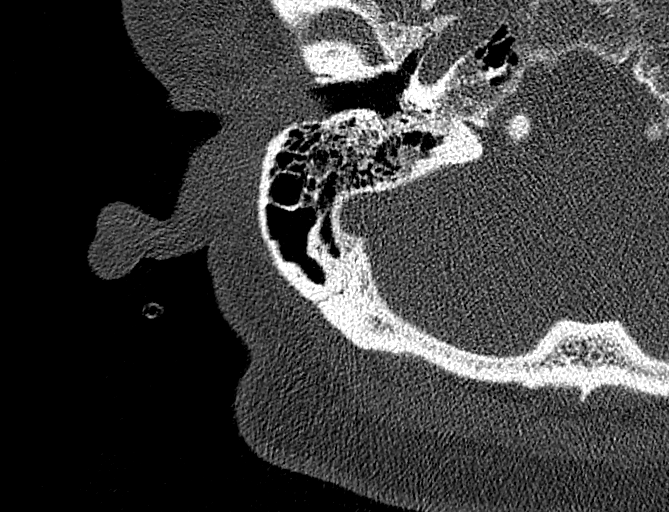
[im 48/95  bone]
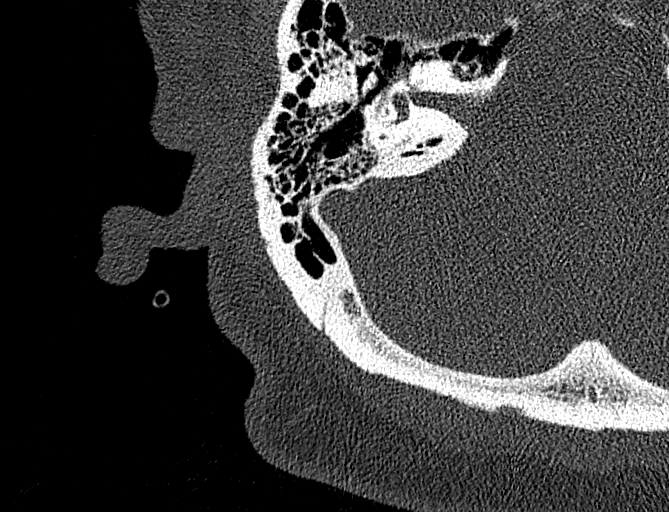
[im 59/95  brain]
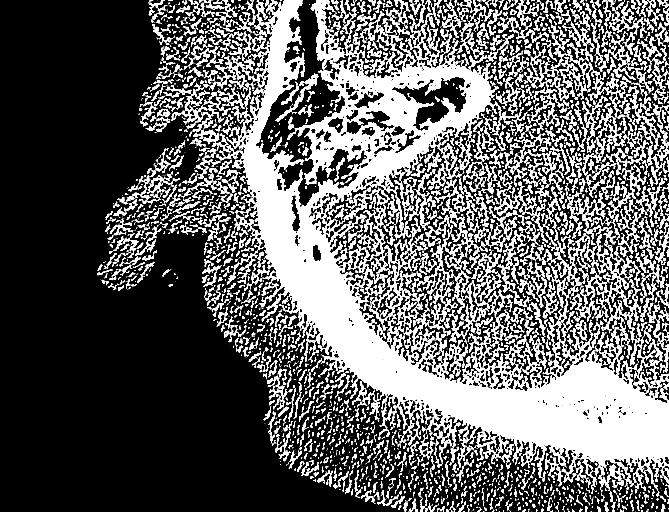
[im 59/95  bone]
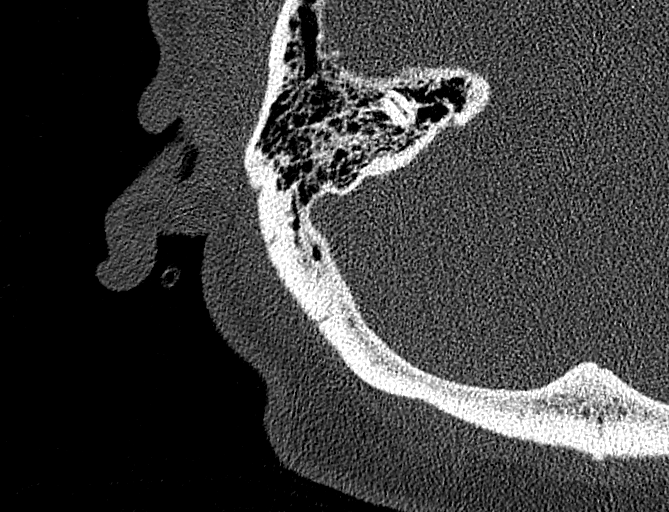
[im 71/95  bone]
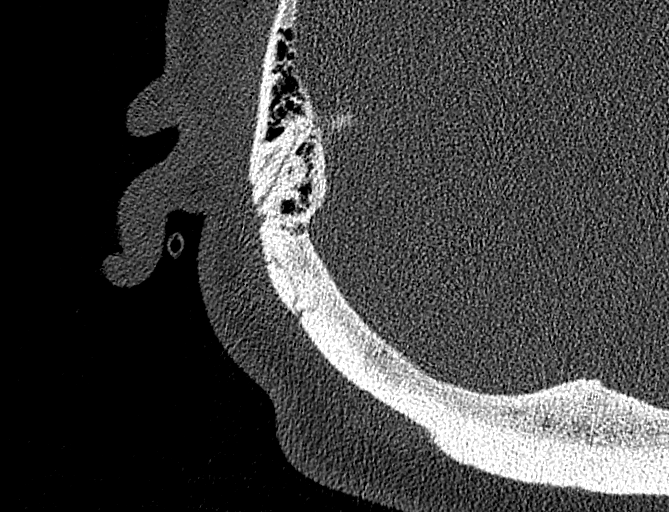
[im 83/95  bone]
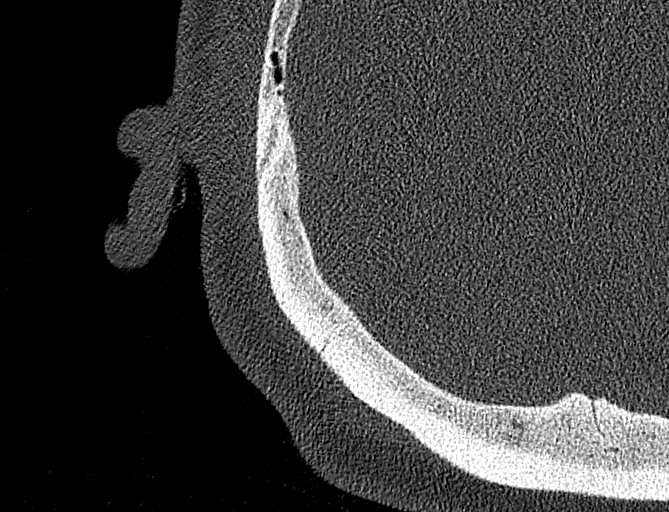

[Series 7: ax mag left · axial · 0.20mm/px · z∈[-112,-90]mm · 4 of 99 slices shown]
[im 13/99  bone]
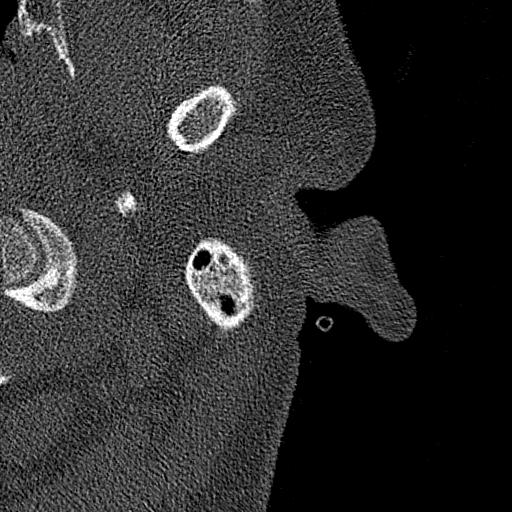
[im 25/99  bone]
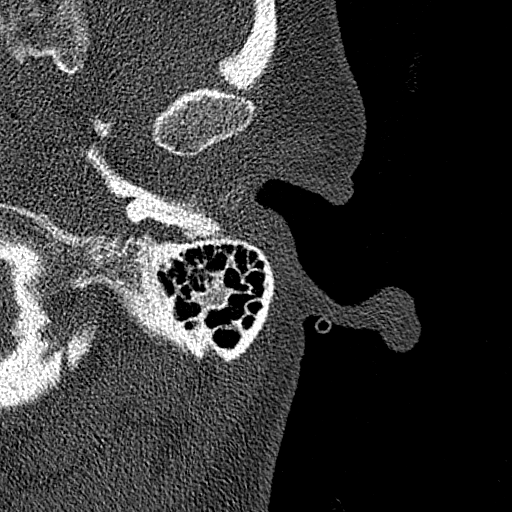
[im 37/99  bone]
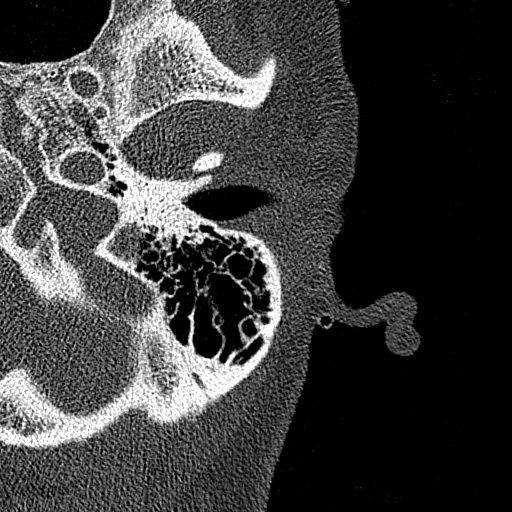
[im 50/99  bone]
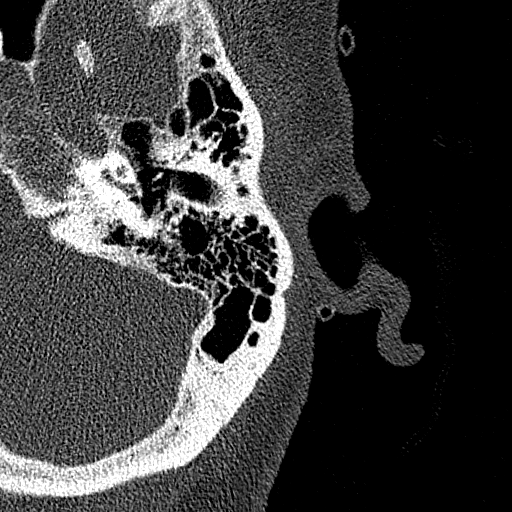

[13 of 40 positions shown; findings below may reference images not displayed]

FINDINGS: RIGHT: There is a moderate soft tissue thickening involving the
external auditory canal, greatest superiorly where there is
extension medially to the tympanic membrane with slight blunting of
the scutum compared to the contralateral side which may indicate
early osseous. No erosive osseous changes are identified more
laterally in the external auditory canal. The ossicles appear
intact. The tympanic cavity is clear. The internal auditory canal,
cochlea, vestibule, and semicircular canals are unremarkable. The
mastoid air cells are clear.

LEFT: The external auditory canal and tympanic membrane are
unremarkable. The ossicles appear intact. The tympanic cavity is
clear. The internal auditory canal, cochlea, vestibule, and
semicircular canals are unremarkable. The mastoid air cells are
clear.

There is mild bilateral ethmoid air cell mucosal thickening.
Calcifications/ossification is noted projecting superomedially from
the posterior margin of the left petrous temporal bone and which may
be associated with the tentorium.
IMPRESSION: Moderate soft tissue thickening in the right external auditory canal
with possible slight erosion of the scutum. This is compatible with
the provided history of otitis externa with possible early bone
infection, however other causes of EAC masses such as cholesteatoma
and squamous cell carcinoma (or other malignancy) are not excluded.

## 2017-09-27 IMAGING — CR DG CHEST 2V
2 series · 2 of 2 positions shown · non-contrast
Comparison: Chest radiograph 03/22/2016

CLINICAL DATA: Patient with flu-like symptoms for 2 weeks.
Persistent cough.

EXAM:
CHEST  2 VIEW

[w chest pa]
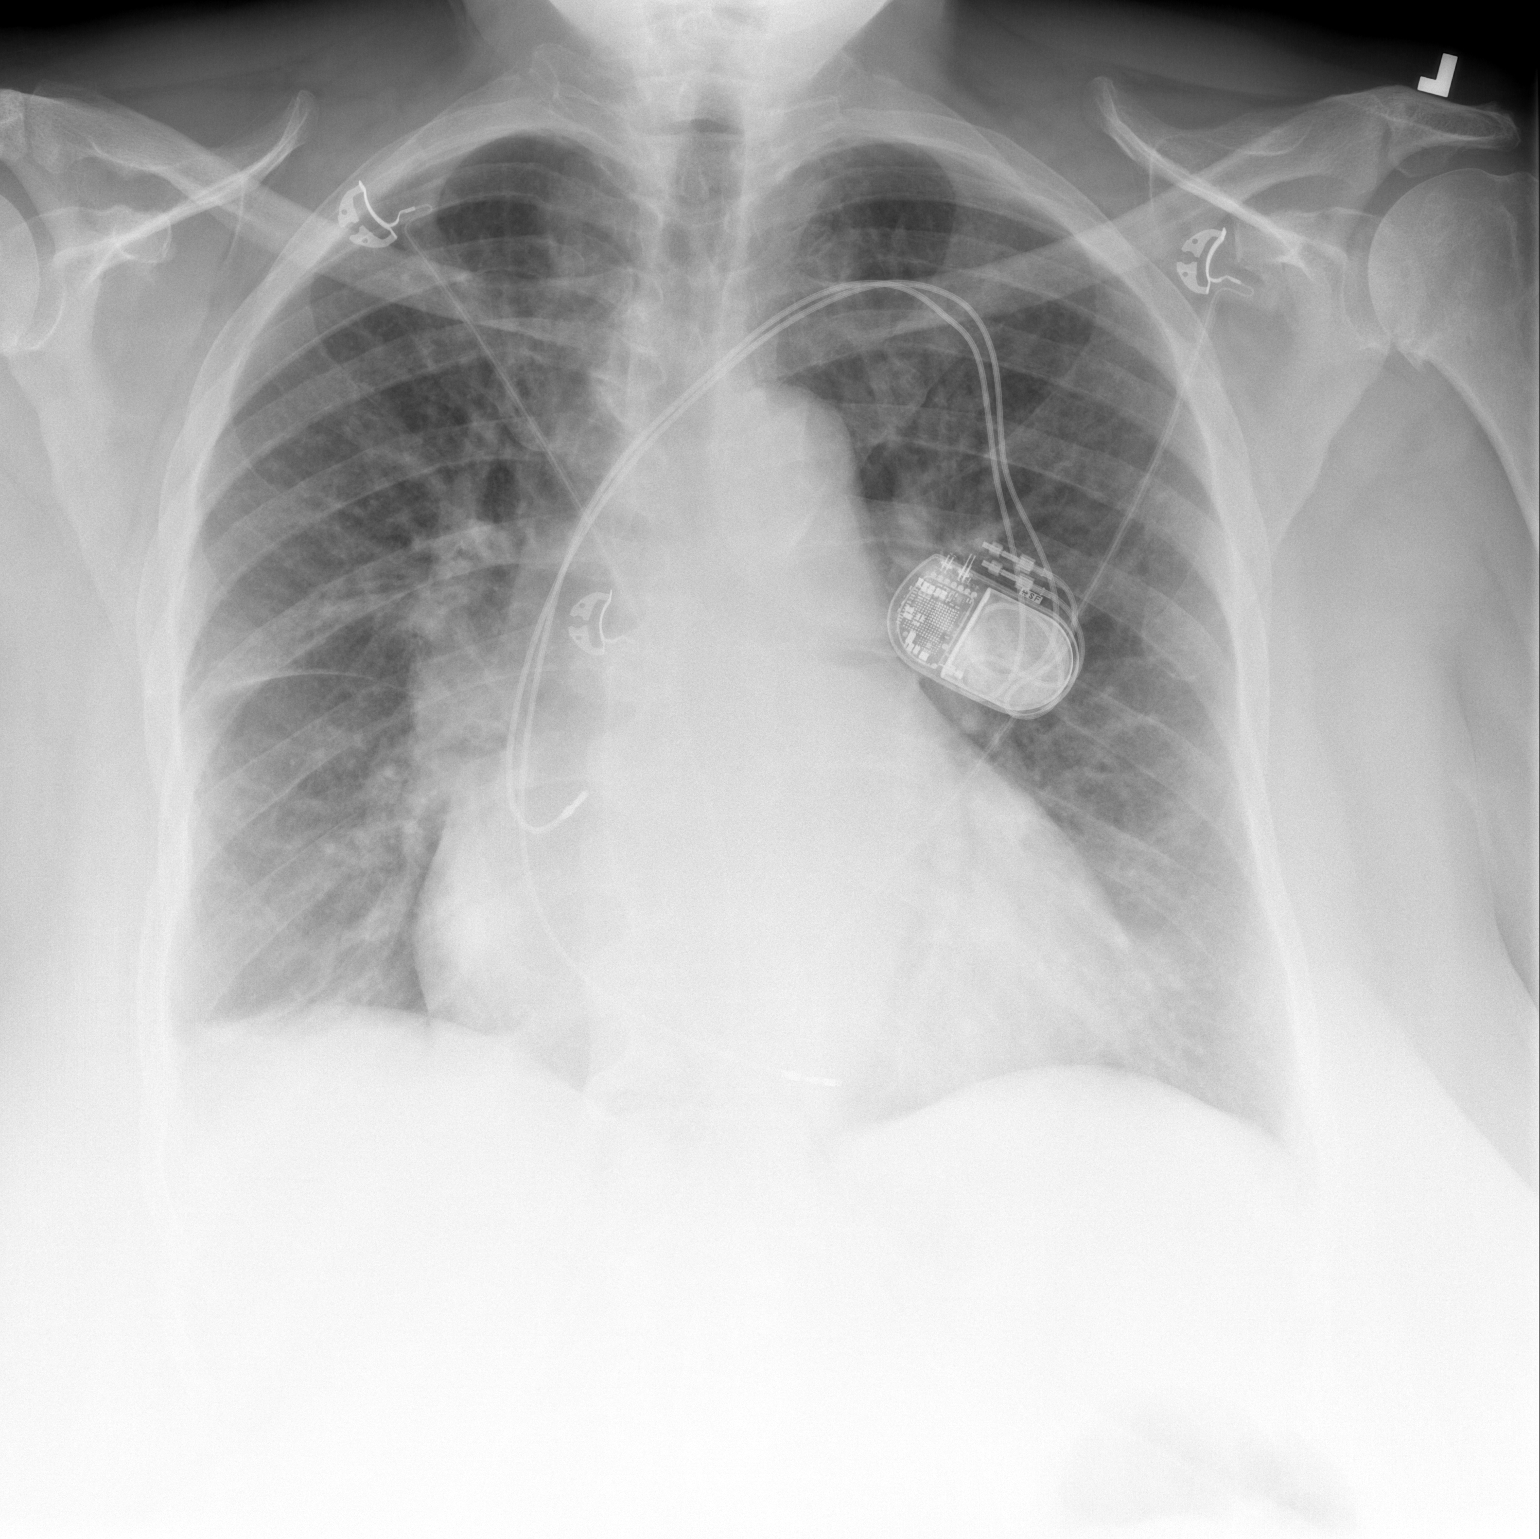

[w chest lat]
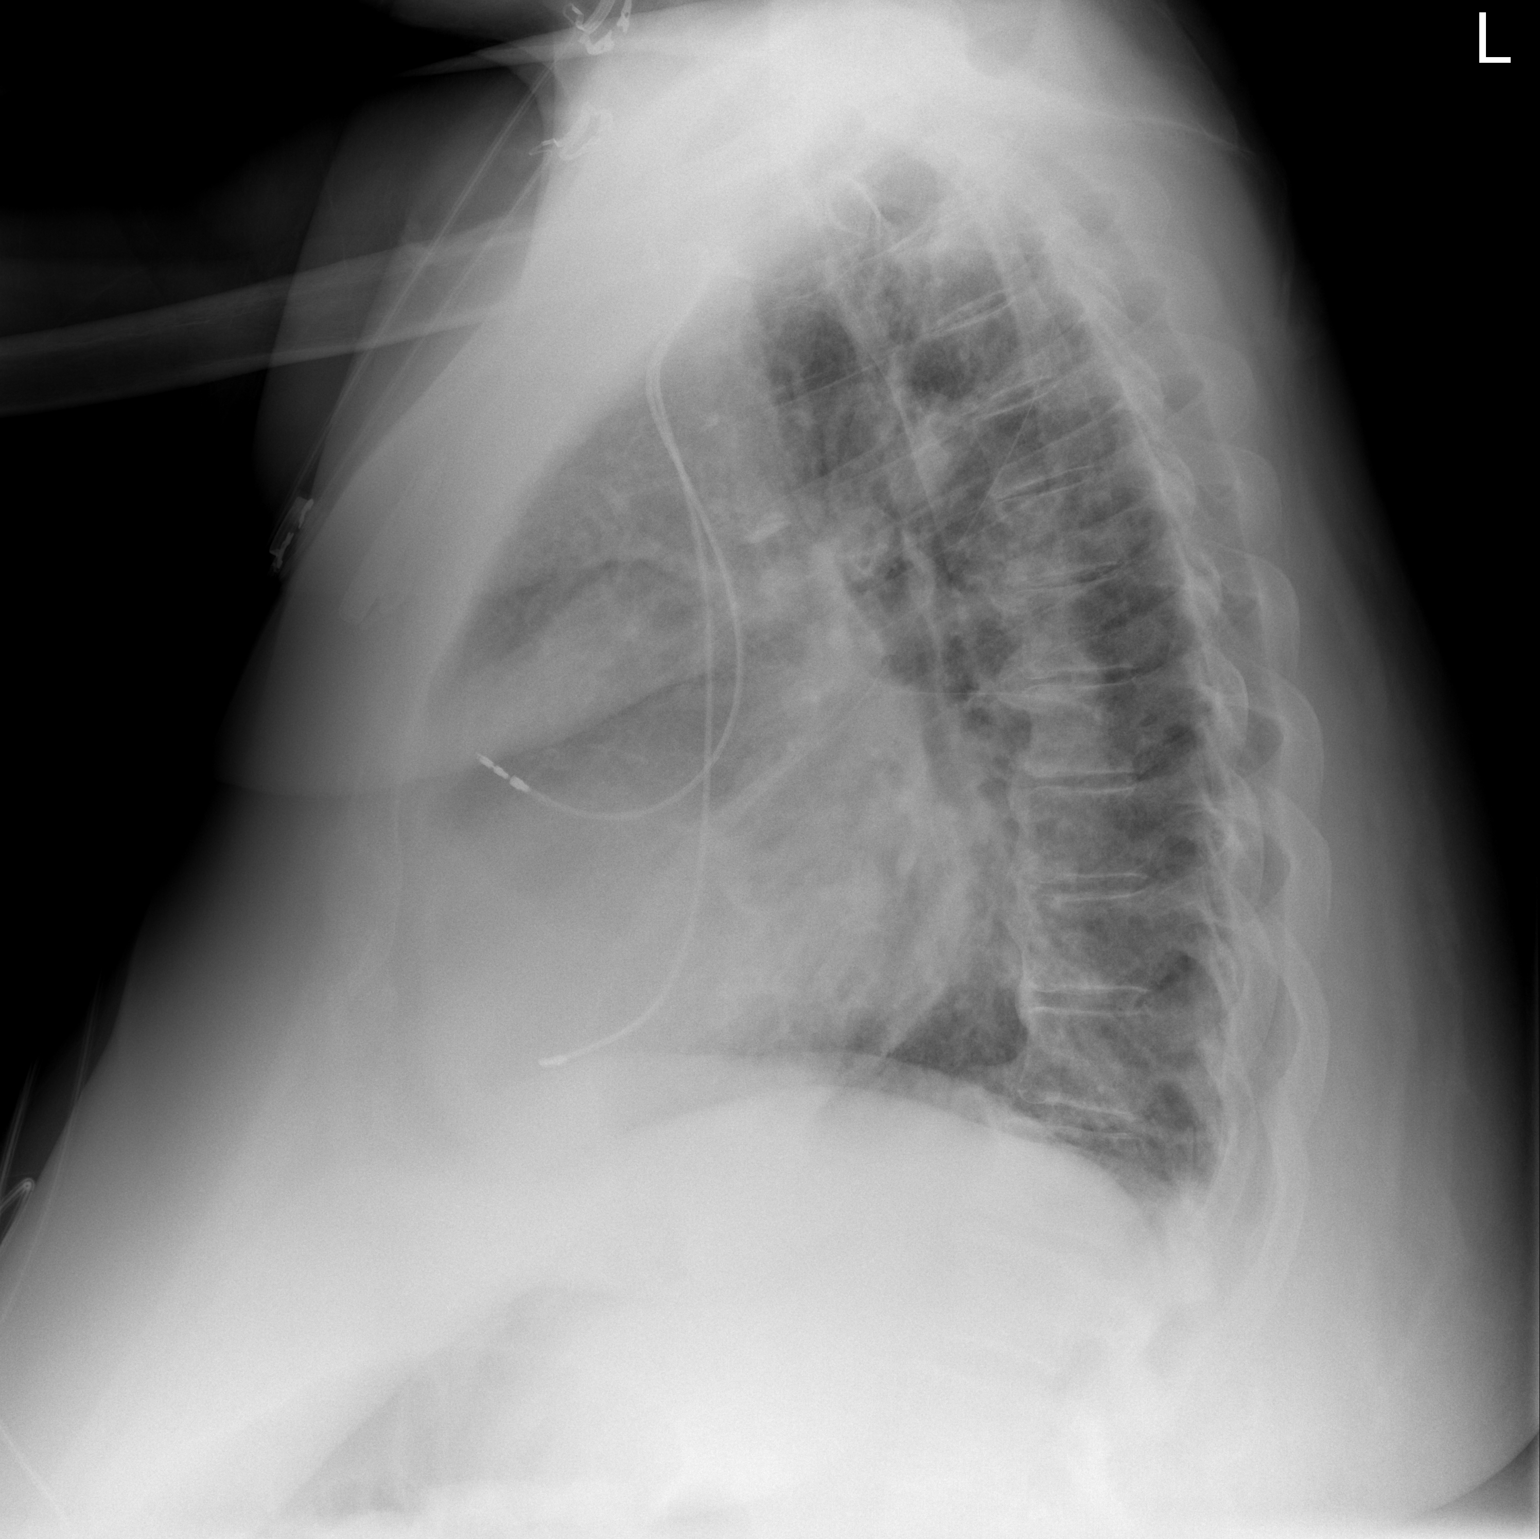

[2 of 2 positions shown; findings below may reference images not displayed]

FINDINGS: Multi lead pacer apparatus overlies the left hemi thorax, leads are
stable in position. Stable cardiomegaly. Pulmonary vascular
redistribution. No large area of pulmonary consolidation. Right
pleural thickening and/or small effusion.
IMPRESSION: Cardiomegaly with pulmonary vascular hypertension, grossly
unchanged.
